# Patient Record
Sex: Female | Born: 1955 | Race: Black or African American | Hispanic: No | Marital: Married | Smoking: Former smoker
Health system: Southern US, Community
[De-identification: ages and names within clinical notes are randomized; demographics above are authoritative.]

## PROBLEM LIST (undated history)

## (undated) DIAGNOSIS — J45909 Unspecified asthma, uncomplicated: Secondary | ICD-10-CM

## (undated) DIAGNOSIS — E119 Type 2 diabetes mellitus without complications: Secondary | ICD-10-CM

## (undated) DIAGNOSIS — E7800 Pure hypercholesterolemia, unspecified: Secondary | ICD-10-CM

## (undated) DIAGNOSIS — G8929 Other chronic pain: Secondary | ICD-10-CM

## (undated) DIAGNOSIS — I10 Essential (primary) hypertension: Secondary | ICD-10-CM

## (undated) DIAGNOSIS — D496 Neoplasm of unspecified behavior of brain: Secondary | ICD-10-CM

## (undated) DIAGNOSIS — K219 Gastro-esophageal reflux disease without esophagitis: Secondary | ICD-10-CM

## (undated) DIAGNOSIS — R519 Headache, unspecified: Secondary | ICD-10-CM

## (undated) DIAGNOSIS — R51 Headache: Secondary | ICD-10-CM

## (undated) HISTORY — PX: KNEE SURGERY: SHX244

## (undated) HISTORY — PX: ABDOMINAL HYSTERECTOMY: SHX81

## (undated) HISTORY — PX: OTHER SURGICAL HISTORY: SHX169

## (undated) HISTORY — PX: SHOULDER SURGERY: SHX246

## (undated) HISTORY — PX: ANTERIOR CRUCIATE LIGAMENT REPAIR: SHX115

---

## 2012-02-16 ENCOUNTER — Emergency Department (HOSPITAL_BASED_OUTPATIENT_CLINIC_OR_DEPARTMENT_OTHER)
Admission: EM | Admit: 2012-02-16 | Discharge: 2012-02-16 | Disposition: A | Discharge status: Skilled Nursing Facility | Payer: Medicare Other | Attending: Emergency Medicine | Admitting: Emergency Medicine

## 2012-02-16 ENCOUNTER — Encounter (HOSPITAL_BASED_OUTPATIENT_CLINIC_OR_DEPARTMENT_OTHER): Payer: Self-pay | Admitting: *Deleted

## 2012-02-16 DIAGNOSIS — E7800 Pure hypercholesterolemia, unspecified: Secondary | ICD-10-CM | POA: Insufficient documentation

## 2012-02-16 DIAGNOSIS — K219 Gastro-esophageal reflux disease without esophagitis: Secondary | ICD-10-CM | POA: Insufficient documentation

## 2012-02-16 DIAGNOSIS — Z7901 Long term (current) use of anticoagulants: Secondary | ICD-10-CM | POA: Insufficient documentation

## 2012-02-16 DIAGNOSIS — R040 Epistaxis: Secondary | ICD-10-CM

## 2012-02-16 DIAGNOSIS — R51 Headache: Secondary | ICD-10-CM | POA: Insufficient documentation

## 2012-02-16 DIAGNOSIS — Z79899 Other long term (current) drug therapy: Secondary | ICD-10-CM | POA: Insufficient documentation

## 2012-02-16 DIAGNOSIS — I10 Essential (primary) hypertension: Secondary | ICD-10-CM | POA: Insufficient documentation

## 2012-02-16 HISTORY — DX: Gastro-esophageal reflux disease without esophagitis: K21.9

## 2012-02-16 HISTORY — DX: Essential (primary) hypertension: I10

## 2012-02-16 HISTORY — DX: Pure hypercholesterolemia, unspecified: E78.00

## 2012-02-16 HISTORY — DX: Neoplasm of unspecified behavior of brain: D49.6

## 2012-02-16 LAB — PROTIME-INR
INR: 1.12 (ref 0.00–1.49)
Prothrombin Time: 14.6 s (ref 11.6–15.2)

## 2012-02-16 MED ORDER — CEPHALEXIN 500 MG PO CAPS: 500.0000 mg | ORAL_CAPSULE | Freq: Two times a day (BID) | ORAL | Status: AC

## 2012-02-16 MED ORDER — OXYCODONE-ACETAMINOPHEN 5-325 MG PO TABS
1.0000 | ORAL_TABLET | Freq: Once | ORAL | Status: AC
  Administered 2012-02-16: 1 via ORAL
  Filled 2012-02-16 (×2): qty 1

## 2012-02-16 NOTE — Discharge Instructions (Signed)
Nosebleed Nosebleeds can be caused by many conditions including trauma, infections, polyps, foreign bodies, dry mucous membranes or climate, medications and air conditioning. Most nosebleeds occur in the front of the nose. It is because of this location that most nosebleeds can be controlled by pinching the nostrils gently and continuously. Do this for at least 10 to 20 minutes. The reason for this long continuous pressure is that you must hold it long enough for the blood to clot. If during that 10 to 20 minute time period, pressure is released, the process may have to be started again. The nosebleed may stop by itself, quit with pressure, need concentrated heating (cautery) or stop with pressure from packing. HOME CARE INSTRUCTIONS   If your nose was packed, try to maintain the pack inside until your caregiver removes it. If a gauze pack was used and it starts to fall out, gently replace or cut the end off. Do not cut if a balloon catheter was used to pack the nose. Otherwise, do not remove unless instructed.   Avoid blowing your nose for 12 hours after treatment. This could dislodge the pack or clot and start bleeding again.   If the bleeding starts again, sit up and bending forward, gently pinch the front half of your nose continuously for 20 minutes.   If bleeding was caused by dry mucous membranes, cover the inside of your nose every morning with a petroleum or antibiotic ointment. Use your little fingertip as an applicator. Do this as needed during dry weather. This will keep the mucous membranes moist and allow them to heal.   Maintain humidity in your home by using less air conditioning or using a humidifier.   Do not use aspirin or medications which make bleeding more likely. Your caregiver can give you recommendations on this.   Resume normal activities as able but try to avoid straining, lifting or bending at the waist for several days.   If the nosebleeds become recurrent and the cause  is unknown, your caregiver may suggest laboratory tests.  SEEK IMMEDIATE MEDICAL CARE IF:   Bleeding recurs and cannot be controlled.   There is unusual bleeding from or bruising on other parts of the body.   You have a fever.   Nosebleeds continue.   There is any worsening of the condition which originally brought you in.   You become lightheaded, feel faint, become sweaty or vomit blood.  MAKE SURE YOU:   Understand these instructions.   Will watch your condition.   Will get help right away if you are not doing well or get worse.  Document Released: 03/20/2005 Document Revised: 05/30/2011 Document Reviewed: 05/12/2009 [employer] Patient Information 2012 [employer]. 

## 2012-02-16 NOTE — ED Notes (Signed)
Pt-int faxed to rehab facility at request of patient and facility RN

## 2012-02-16 NOTE — ED Notes (Signed)
Patient c/o intermittent nosebleed since last night, R knee surgery last week and taking coumadin

## 2012-02-16 NOTE — ED Provider Notes (Signed)
History     CSN: [id number]  Arrival date & time 02/16/12  1028   First MD Initiated Contact with Patient 02/16/12 1058      Chief Complaint  Patient presents with  . Epistaxis    (Consider location/radiation/quality/duration/timing/severity/associated sxs/prior treatment) HPI Comments: Patient is currently residing at a skilled nursing facility for rehabilitation following a right knee surgery a week ago. She is currently on Coumadin to help prevent DVT. She reports that she's had occasional nosebleeds in the past, once requiring packing when she was a child. She developed a nosebleed yesterday only under the left side of her nose, occasionally tasting and swallowing blood down her throat. She reports with holding pressure by herself as well as by staff at her facility, and eventually the bleeding seemed to stop. However during breakfast this morning it started bleeding again and has not been able to be controlled. She reports she has a mild frontal headache which she attributes to the nosebleed and she has not been able to take her pain medication this morning. She denies change in sensorium, numbness or weakness of her extremities. She does take medication for high blood pressure as well. She denies stiff neck, nausea, abdominal pain. She denies any shortness of breath, wheezing.  The history is provided by the patient and a relative.    Past Medical History  Diagnosis Date  . Hypertension   . Acid reflux   . Brain tumor   . High cholesterol     Past Surgical History  Procedure Date  . Knee surgery     right  . Bladder tack   . Shoulder surgery   . Anterior cruciate ligament repair     History reviewed. No pertinent family history.  History  Substance Use Topics  . Smoking status: Former Games developer  . Smokeless tobacco: Not on file  . Alcohol Use: No    OB History    Grav Para Term Preterm Abortions TAB SAB Ect Mult Living                  Review of Systems    Constitutional: Negative for fever and chills.  HENT: Positive for nosebleeds. Negative for sore throat, trouble swallowing and neck pain.   Respiratory: Negative for cough and shortness of breath.   Musculoskeletal: Negative for back pain.  Neurological: Positive for headaches. Negative for dizziness, weakness, light-headedness and numbness.    Allergies  Tramadol  Home Medications   Current Outpatient Rx  Name Route Sig Dispense Refill  . ALBUTEROL SULFATE (2.5 MG/3ML) 0.083% IN NEBU Nebulization Take 2.5 mg by nebulization every 6 (six) hours as needed.    . ATORVASTATIN CALCIUM 80 MG PO TABS Oral Take 40 mg by mouth daily.    Marland Kitchen EZETIMIBE 10 MG PO TABS Oral Take 10 mg by mouth daily.    Marland Kitchen FLUTICASONE-SALMETEROL 250-50 MCG/DOSE IN AEPB Inhalation Inhale 1 puff into the lungs every 12 (twelve) hours.    Marland Kitchen LISINOPRIL 40 MG PO TABS Oral Take 40 mg by mouth daily.    Marland Kitchen OMEPRAZOLE 20 MG PO CPDR Oral Take 20 mg by mouth daily.    . OXYCODONE-ACETAMINOPHEN 5-325 MG PO TABS Oral Take 1 tablet by mouth every 4 (four) hours as needed.    Marland Kitchen POTASSIUM CHLORIDE 20 MEQ PO PACK Oral Take 20 mEq by mouth daily. tablet    . TIOTROPIUM BROMIDE MONOHYDRATE 18 MCG IN CAPS Inhalation Place 18 mcg into inhaler and inhale daily.    Marland Kitchen  TOPIRAMATE 100 MG PO TABS Oral Take 100 mg by mouth at bedtime.    Marland Kitchen VERAPAMIL HCL ER (CO) 180 MG PO TB24 Oral Take 180 mg by mouth 2 (two) times daily.    Marland Kitchen COUMADIN PO Oral Take 2.5 mg by mouth at bedtime.     . CEPHALEXIN 500 MG PO CAPS Oral Take 1 capsule (500 mg total) by mouth 2 (two) times daily. 14 capsule 0    BP 170/94  Pulse 19  Temp 98.2 F (36.8 C) (Oral)  Resp 19  SpO2 95%  Physical Exam  Nursing note and vitals reviewed. Constitutional: She is oriented to person, place, and time. She appears well-developed and well-nourished.  HENT:  Head: Normocephalic and atraumatic.  Nose: Mucosal edema present. No rhinorrhea. Epistaxis is observed.   Mouth/Throat: Uvula is midline, oropharynx is clear and moist and mucous membranes are normal.  Eyes: Pupils are equal, round, and reactive to light. No scleral icterus.  Neck: Normal range of motion. Neck supple.  Cardiovascular: Normal rate.   Pulmonary/Chest: Effort normal. No respiratory distress. She has no wheezes.  Abdominal: Soft. She exhibits no distension. There is no tenderness.  Neurological: She is alert and oriented to person, place, and time. Coordination normal.  Skin: Skin is warm.    ED Course  EPISTAXIS MANAGEMENT Date/Time: 02/16/2012 11:19 AM Performed by: Lear Ng. Authorized by: Lear Ng Consent: Verbal consent obtained. Written consent not obtained. Risks and benefits: risks, benefits and alternatives were discussed Consent given by: patient Patient understanding: patient states understanding of the procedure being performed Patient consent: the patient's understanding of the procedure matches consent given Patient identity confirmed: verbally with patient Time out: Immediately prior to procedure a "time out" was called to verify the correct patient, procedure, equipment, support staff and site/side marked as required. Patient sedated: no Treatment site: left anterior Post-procedure assessment: bleeding decreased Treatment complexity: simple Patient tolerance: Patient tolerated the procedure well with no immediate complications. Comments: 5.5 rapid rhino.  4.5 cc of air initially   (including critical care time)   Labs Reviewed  PROTIME-INR   No results found.   1. Epistaxis    1:25 PM Bleeding improved   MDM  Room air saturation is 95% and I interpret this to be adequate.  Once patient was able to blow out a clot was large from the left side, bleeding continued. Decision made to insert rapid rhino tampon into the left nares, 5.5 cm which has improved bleeding. Given that she is on Coumadin and I think in order to prevent DVT,  continue Coumadin is important, packing was appropriate. Plan is to treat her pain, continue monitoring her, will check a INR here. Otherwise she'll be given a referral to [facility] for followup early this week. We'll also put her on Keflex to help prevent sinusitis.        Gavin Pound. Kamrie Fanton, MD 02/16/12 1325

## 2014-10-05 ENCOUNTER — Encounter (HOSPITAL_BASED_OUTPATIENT_CLINIC_OR_DEPARTMENT_OTHER): Payer: Self-pay | Admitting: *Deleted

## 2014-10-05 ENCOUNTER — Emergency Department (HOSPITAL_BASED_OUTPATIENT_CLINIC_OR_DEPARTMENT_OTHER)
Admission: EM | Admit: 2014-10-05 | Discharge: 2014-10-05 | Disposition: A | Discharge status: Home / Self Care | Payer: Medicare Other | Attending: Emergency Medicine | Admitting: Emergency Medicine

## 2014-10-05 DIAGNOSIS — Z87891 Personal history of nicotine dependence: Secondary | ICD-10-CM | POA: Insufficient documentation

## 2014-10-05 DIAGNOSIS — Z86011 Personal history of benign neoplasm of the brain: Secondary | ICD-10-CM | POA: Insufficient documentation

## 2014-10-05 DIAGNOSIS — K219 Gastro-esophageal reflux disease without esophagitis: Secondary | ICD-10-CM | POA: Diagnosis not present

## 2014-10-05 DIAGNOSIS — J029 Acute pharyngitis, unspecified: Secondary | ICD-10-CM | POA: Diagnosis present

## 2014-10-05 DIAGNOSIS — Z79899 Other long term (current) drug therapy: Secondary | ICD-10-CM | POA: Insufficient documentation

## 2014-10-05 DIAGNOSIS — I10 Essential (primary) hypertension: Secondary | ICD-10-CM | POA: Insufficient documentation

## 2014-10-05 DIAGNOSIS — E780 Pure hypercholesterolemia: Secondary | ICD-10-CM | POA: Diagnosis not present

## 2014-10-05 MED ORDER — KETOROLAC TROMETHAMINE 60 MG/2ML IM SOLN
30.0000 mg | Freq: Once | INTRAMUSCULAR | Status: AC
  Administered 2014-10-05: 30 mg via INTRAMUSCULAR
  Filled 2014-10-05: qty 2

## 2014-10-05 NOTE — ED Notes (Signed)
Patient given water for PO challenge.  

## 2014-10-05 NOTE — ED Notes (Signed)
Pt c/o sore throat x 1 day

## 2014-10-05 NOTE — Discharge Instructions (Signed)
Return to the emergency room with worsening of symptoms, new symptoms or with symptoms that are concerning , especially fevers, stiff neck, worsening headache, nausea/vomiting, visual changes or slurred speech, chest pain, shortness of breath, cough with thick colored mucous or blood Drink plenty of fluids with electrolytes especially Gatorade. OTC cold medications such as mucinex, nyquil, dayquil are recommended. Chloraseptic for sore throat. Ibuprofen 400mg  (2 tablets 200mg ) every 5-6 hours for 3-5 days. Follow up with PCP tomorrow as scheduled. Read below information and follow recommendations. Pharyngitis Pharyngitis is redness, pain, and swelling (inflammation) of your pharynx.  CAUSES  Pharyngitis is usually caused by infection. Most of the time, these infections are from viruses (viral) and are part of a cold. However, sometimes pharyngitis is caused by bacteria (bacterial). Pharyngitis can also be caused by allergies. Viral pharyngitis may be spread from person to person by coughing, sneezing, and personal items or utensils (cups, forks, spoons, toothbrushes). Bacterial pharyngitis may be spread from person to person by more intimate contact, such as kissing.  SIGNS AND SYMPTOMS  Symptoms of pharyngitis include:   Sore throat.   Tiredness (fatigue).   Low-grade fever.   Headache.  Joint pain and muscle aches.  Skin rashes.  Swollen lymph nodes.  Plaque-like film on throat or tonsils (often seen with bacterial pharyngitis). DIAGNOSIS  Your health care provider will ask you questions about your illness and your symptoms. Your medical history, along with a physical exam, is often all that is needed to diagnose pharyngitis. Sometimes, a rapid strep test is done. Other lab tests may also be done, depending on the suspected cause.  TREATMENT  Viral pharyngitis will usually get better in 3-4 days without the use of medicine. Bacterial pharyngitis is treated with medicines that kill  germs (antibiotics).  HOME CARE INSTRUCTIONS   Drink enough water and fluids to keep your urine clear or pale yellow.   Only take over-the-counter or prescription medicines as directed by your health care provider:   If you are prescribed antibiotics, make sure you finish them even if you start to feel better.   Do not take aspirin.   Get lots of rest.   Gargle with 8 oz of salt water ( tsp of salt per 1 qt of water) as often as every 1-2 hours to soothe your throat.   Throat lozenges (if you are not at risk for choking) or sprays may be used to soothe your throat. SEEK MEDICAL CARE IF:   You have large, tender lumps in your neck.  You have a rash.  You cough up green, yellow-brown, or bloody spit. SEEK IMMEDIATE MEDICAL CARE IF:   Your neck becomes stiff.  You drool or are unable to swallow liquids.  You vomit or are unable to keep medicines or liquids down.  You have severe pain that does not go away with the use of recommended medicines.  You have trouble breathing (not caused by a stuffy nose). MAKE SURE YOU:   Understand these instructions.  Will watch your condition.  Will get help right away if you are not doing well or get worse. Document Released: 06/10/2005 Document Revised: 03/31/2013 Document Reviewed: 02/15/2013 [facility] Patient Information 2015 [place], [place]. This information is not intended to replace advice given to you by your health care provider. Make sure you discuss any questions you have with your health care provider.  Salt Water Gargle This solution will help make your mouth and throat feel better. HOME CARE INSTRUCTIONS   Mix 1  teaspoon of salt in 8 ounces of warm water.  Gargle with this solution as much or often as you need or as directed. Swish and gargle gently if you have any sores or wounds in your mouth.  Do not swallow this mixture. Document Released: 03/14/2004 Document Revised: 09/02/2011 Document Reviewed:  08/05/2008 [facility] Patient Information 2015 [place], [place]. This information is not intended to replace advice given to you by your health care provider. Make sure you discuss any questions you have with your health care provider.

## 2014-10-05 NOTE — ED Provider Notes (Signed)
CSN: [id number]     Arrival date & time 10/05/14  1842 History   First MD Initiated Contact with Patient 10/05/14 2022     Chief Complaint  Patient presents with  . Sore Throat     (Consider location/radiation/quality/duration/timing/severity/associated sxs/prior Treatment) HPI  Kimberly Cooley is a 59 y.o. female with PMH of hypertension, acid reflux, elevated cholesterol presenting with sore throat described as scratchy and worse with cough and eating. Patient states Kimberly Cooley has had these symptoms since last night. Cough is nonproductive and Kimberly Cooley denies any fevers, chills. Kimberly Cooley is not taking anything for her symptoms. Kimberly Cooley denies any chest pain, difficulty breathing, shortness of breath. Patient reports primary care appointment tomorrow.   Past Medical History  Diagnosis Date  . Hypertension   . Acid reflux   . Brain tumor   . High cholesterol    Past Surgical History  Procedure Laterality Date  . Knee surgery      right  . Bladder tack    . Shoulder surgery    . Anterior cruciate ligament repair    . Abdominal hysterectomy     History reviewed. No pertinent family history. History  Substance Use Topics  . Smoking status: Former Research scientist (life sciences)  . Smokeless tobacco: Not on file  . Alcohol Use: No   OB History    No data available     Review of Systems  Constitutional: Negative for fever and chills.  HENT: Positive for congestion and sore throat. Negative for rhinorrhea.   Respiratory: Positive for cough. Negative for shortness of breath.   Gastrointestinal: Negative for nausea, vomiting and diarrhea.      Allergies  Tramadol  Home Medications   Prior to Admission medications   Medication Sig Start Date End Date Taking? Authorizing Provider  albuterol (PROVENTIL) (2.5 MG/3ML) 0.083% nebulizer solution Take 2.5 mg by nebulization every 6 (six) hours as needed.    Historical Provider, MD  atorvastatin (LIPITOR) 80 MG tablet Take 40 mg by mouth daily.    Historical Provider,  MD  ezetimibe (ZETIA) 10 MG tablet Take 10 mg by mouth daily.    Historical Provider, MD  Fluticasone-Salmeterol (ADVAIR) 250-50 MCG/DOSE AEPB Inhale 1 puff into the lungs every 12 (twelve) hours.    Historical Provider, MD  lisinopril (PRINIVIL,ZESTRIL) 40 MG tablet Take 40 mg by mouth daily.    Historical Provider, MD  omeprazole (PRILOSEC) 20 MG capsule Take 20 mg by mouth daily.    Historical Provider, MD  oxyCODONE-acetaminophen (PERCOCET/ROXICET) 5-325 MG per tablet Take 1 tablet by mouth every 4 (four) hours as needed.    Historical Provider, MD  potassium chloride (KLOR-CON) 20 MEQ packet Take 20 mEq by mouth daily. tablet    Historical Provider, MD  tiotropium (SPIRIVA) 18 MCG inhalation capsule Place 18 mcg into inhaler and inhale daily.    Historical Provider, MD  topiramate (TOPAMAX) 100 MG tablet Take 100 mg by mouth at bedtime.    Historical Provider, MD  verapamil (COVERA HS) 180 MG (CO) 24 hr tablet Take 180 mg by mouth 2 (two) times daily.    Historical Provider, MD   BP 158/82 mmHg  Pulse 68  Temp(Src) 98.5 F (36.9 C) (Oral)  Resp 20  Ht 5\' 9"  (1.753 m)  Wt 230 lb (104.327 kg)  BMI 33.95 kg/m2  SpO2 98% Physical Exam  Constitutional: Kimberly Cooley appears well-developed and well-nourished. No distress.  HENT:  Head: Normocephalic and atraumatic.  Nose: Right sinus exhibits no maxillary sinus tenderness and no  frontal sinus tenderness. Left sinus exhibits no maxillary sinus tenderness and no frontal sinus tenderness.  Mouth/Throat: Mucous membranes are normal. Posterior oropharyngeal edema and posterior oropharyngeal erythema present. No oropharyngeal exudate.  No trismus or uvula deviation. No facial swelling, tongue swelling.  Eyes: Conjunctivae and EOM are normal. Right eye exhibits no discharge. Left eye exhibits no discharge.  Neck: Normal range of motion. Neck supple.  Cardiovascular: Normal rate, regular rhythm and normal heart sounds.   Pulmonary/Chest: Effort normal and  breath sounds normal. No respiratory distress. Kimberly Cooley has no wheezes. Kimberly Cooley has no rales.  Abdominal: Soft. Bowel sounds are normal. Kimberly Cooley exhibits no distension. There is no tenderness.  Lymphadenopathy:    Kimberly Cooley has cervical adenopathy.  Neurological: Kimberly Cooley is alert.  Skin: Skin is warm and dry. Kimberly Cooley is not diaphoretic.  Nursing note and vitals reviewed.   ED Course  Procedures (including critical care time) Labs Review Labs Reviewed - No data to display  Imaging Review No results found.   EKG Interpretation None      MDM   Final diagnoses:  Viral pharyngitis   Pt afebrile without tonsillar exudate. Patient with central criteria of 1 indicating probably of strep 5-10%. No indication for strep testing or antibiotics. Presents with mild cervical lymphadenopathy, & dysphagia; diagnosis of viral pharyngitis.DC w symptomatic tx for pain  Pt does not appear dehydrated, but did discuss importance of water rehydration. Presentation non concerning for PTA or infxn spread to soft tissue. No trismus or uvula deviation. Specific return precautions discussed. Pt able to drink water in ED without difficulty with intact air way. PCP follow-up in one day as scheduled  Discussed return precautions with patient. Discussed all results and patient verbalizes understanding and agrees with plan.   Al Corpus, PA-C 10/05/14 2144  Ernestina Patches, MD 10/06/14 [phone]

## 2015-03-18 ENCOUNTER — Emergency Department (HOSPITAL_BASED_OUTPATIENT_CLINIC_OR_DEPARTMENT_OTHER): Payer: Medicare Other

## 2015-03-18 ENCOUNTER — Encounter (HOSPITAL_BASED_OUTPATIENT_CLINIC_OR_DEPARTMENT_OTHER): Payer: Self-pay | Admitting: Emergency Medicine

## 2015-03-18 DIAGNOSIS — Y9289 Other specified places as the place of occurrence of the external cause: Secondary | ICD-10-CM | POA: Insufficient documentation

## 2015-03-18 DIAGNOSIS — Z7951 Long term (current) use of inhaled steroids: Secondary | ICD-10-CM | POA: Insufficient documentation

## 2015-03-18 DIAGNOSIS — Y998 Other external cause status: Secondary | ICD-10-CM | POA: Diagnosis not present

## 2015-03-18 DIAGNOSIS — Z79899 Other long term (current) drug therapy: Secondary | ICD-10-CM | POA: Diagnosis not present

## 2015-03-18 DIAGNOSIS — Z87891 Personal history of nicotine dependence: Secondary | ICD-10-CM | POA: Insufficient documentation

## 2015-03-18 DIAGNOSIS — Y9389 Activity, other specified: Secondary | ICD-10-CM | POA: Insufficient documentation

## 2015-03-18 DIAGNOSIS — I10 Essential (primary) hypertension: Secondary | ICD-10-CM | POA: Insufficient documentation

## 2015-03-18 DIAGNOSIS — K219 Gastro-esophageal reflux disease without esophagitis: Secondary | ICD-10-CM | POA: Insufficient documentation

## 2015-03-18 DIAGNOSIS — S93502A Unspecified sprain of left great toe, initial encounter: Secondary | ICD-10-CM | POA: Insufficient documentation

## 2015-03-18 DIAGNOSIS — W010XXA Fall on same level from slipping, tripping and stumbling without subsequent striking against object, initial encounter: Secondary | ICD-10-CM | POA: Insufficient documentation

## 2015-03-18 DIAGNOSIS — Z85841 Personal history of malignant neoplasm of brain: Secondary | ICD-10-CM | POA: Diagnosis not present

## 2015-03-18 DIAGNOSIS — S99922A Unspecified injury of left foot, initial encounter: Secondary | ICD-10-CM | POA: Diagnosis present

## 2015-03-18 DIAGNOSIS — E780 Pure hypercholesterolemia: Secondary | ICD-10-CM | POA: Diagnosis not present

## 2015-03-18 NOTE — ED Notes (Signed)
Patient states that she tripped and fell last night and hurt her left big toe

## 2015-03-19 ENCOUNTER — Emergency Department (HOSPITAL_BASED_OUTPATIENT_CLINIC_OR_DEPARTMENT_OTHER)
Admission: EM | Admit: 2015-03-19 | Discharge: 2015-03-19 | Disposition: A | Discharge status: Home / Self Care | Payer: Medicare Other | Attending: Emergency Medicine | Admitting: Emergency Medicine

## 2015-03-19 DIAGNOSIS — S93502A Unspecified sprain of left great toe, initial encounter: Secondary | ICD-10-CM

## 2015-03-19 HISTORY — DX: Headache, unspecified: R51.9

## 2015-03-19 HISTORY — DX: Headache: R51

## 2015-03-19 MED ORDER — HYDROCODONE-ACETAMINOPHEN 5-325 MG PO TABS: 1.0000 | ORAL_TABLET | Freq: Four times a day (QID) | ORAL | Status: AC | PRN

## 2015-03-19 MED ORDER — HYDROCODONE-ACETAMINOPHEN 5-325 MG PO TABS
1.0000 | ORAL_TABLET | Freq: Once | ORAL | Status: DC
  Filled 2015-03-19: qty 1

## 2015-03-19 NOTE — ED Provider Notes (Signed)
CSN: [id number]     Arrival date & time 03/18/15  2140 History  This chart was scribed for Shanon Rosser, MD by Meriel Pica, ED Scribe. This patient was seen in room MH04/MH04 and the patient's care was started 12:54 AM.  Chief Complaint  Patient presents with  . Toe Injury   The history is provided by the patient. No language interpreter was used.   HPI Comments: Kimberly Cooley is a 59 y.o. female, with a PMhx of HTN and HLD, who presents to the Emergency Department complaining of left great toe pain s/p trip-and-fall injury that occurred last night. She is having "pretty bad" pain, worse with movement or weightbearing. She denies any other injuries attributable to the fall. She has BLE edema at baseline.    Past Medical History  Diagnosis Date  . Hypertension   . Acid reflux   . Brain tumor   . High cholesterol   . Headache    Past Surgical History  Procedure Laterality Date  . Knee surgery      right  . Bladder tack    . Shoulder surgery    . Anterior cruciate ligament repair    . Abdominal hysterectomy     History reviewed. No pertinent family history. Social History  Substance Use Topics  . Smoking status: Former Research scientist (life sciences)  . Smokeless tobacco: None  . Alcohol Use: No   OB History    No data available     Review of Systems 10 Systems reviewed and all are negative for acute change except as noted in the HPI. Allergies  Bactrim and Tramadol  Home Medications   Prior to Admission medications   Medication Sig Start Date End Date Taking? Authorizing Provider  albuterol (PROVENTIL) (2.5 MG/3ML) 0.083% nebulizer solution Take 2.5 mg by nebulization every 6 (six) hours as needed.    Historical Provider, MD  atorvastatin (LIPITOR) 80 MG tablet Take 40 mg by mouth daily.    Historical Provider, MD  ezetimibe (ZETIA) 10 MG tablet Take 10 mg by mouth daily.    Historical Provider, MD  Fluticasone-Salmeterol (ADVAIR) 250-50 MCG/DOSE AEPB Inhale 1 puff into the lungs every 12  (twelve) hours.    Historical Provider, MD  HYDROcodone-acetaminophen (NORCO) 5-325 MG per tablet Take 1-2 tablets by mouth every 6 (six) hours as needed (for pain). 03/19/15   John Molpus, MD  lisinopril (PRINIVIL,ZESTRIL) 40 MG tablet Take 40 mg by mouth daily.    Historical Provider, MD  omeprazole (PRILOSEC) 20 MG capsule Take 20 mg by mouth daily.    Historical Provider, MD  oxyCODONE-acetaminophen (PERCOCET/ROXICET) 5-325 MG per tablet Take 1 tablet by mouth every 4 (four) hours as needed.    Historical Provider, MD  potassium chloride (KLOR-CON) 20 MEQ packet Take 20 mEq by mouth daily. tablet    Historical Provider, MD  tiotropium (SPIRIVA) 18 MCG inhalation capsule Place 18 mcg into inhaler and inhale daily.    Historical Provider, MD  topiramate (TOPAMAX) 100 MG tablet Take 100 mg by mouth at bedtime.    Historical Provider, MD  verapamil (COVERA HS) 180 MG (CO) 24 hr tablet Take 180 mg by mouth 2 (two) times daily.    Historical Provider, MD   BP 196/104 mmHg  Pulse 84  Temp(Src) 98.2 F (36.8 C) (Oral)  Resp 18  Ht 5\' 9"  (1.753 m)  Wt 250 lb (113.399 kg)  BMI 36.90 kg/m2  SpO2 97% Physical Exam General: Well-developed, well-nourished female in no acute distress; appearance consistent  with age of record HENT: normocephalic; atraumatic Eyes: pupils equal, round and reactive to light; extraocular muscles intact Neck: supple Heart: regular rate and rhythm Lungs: clear to auscultation bilaterally Abdomen: soft; nondistended; nontender; bowel sounds present Extremities: Full range of motion except left great toe limited by pain; pulses normal; hallux valgus bilaterally; tenderness of left great toe without significant ecchymosis or swelling; trace edema of lower legs bilaterally  Neurologic: Awake, alert and oriented; motor function intact in all extremities and symmetric; no facial droop Skin: Warm and dry Psychiatric: Normal mood and affect   ED Course  Procedures   DIAGNOSTIC STUDIES: Oxygen Saturation is 97%, RA, normal by my interpretation.    COORDINATION OF CARE: 12:57 AM Discussed treatment plan with pt at bedside and pt agreed to plan.    MDM   Final diagnoses:  Sprain of left great toe, initial encounter   I personally performed the services described in this documentation, which was scribed in my presence. The recorded information has been reviewed and is accurate.    Shanon Rosser, MD 03/19/15 [phone]

## 2015-10-17 ENCOUNTER — Emergency Department (HOSPITAL_BASED_OUTPATIENT_CLINIC_OR_DEPARTMENT_OTHER)
Admission: EM | Admit: 2015-10-17 | Discharge: 2015-10-17 | Disposition: A | Discharge status: Home / Self Care | Payer: Medicare Other | Attending: Emergency Medicine | Admitting: Emergency Medicine

## 2015-10-17 ENCOUNTER — Encounter (HOSPITAL_BASED_OUTPATIENT_CLINIC_OR_DEPARTMENT_OTHER): Payer: Self-pay | Admitting: *Deleted

## 2015-10-17 DIAGNOSIS — R0602 Shortness of breath: Secondary | ICD-10-CM | POA: Diagnosis not present

## 2015-10-17 DIAGNOSIS — D496 Neoplasm of unspecified behavior of brain: Secondary | ICD-10-CM | POA: Diagnosis not present

## 2015-10-17 DIAGNOSIS — Z7722 Contact with and (suspected) exposure to environmental tobacco smoke (acute) (chronic): Secondary | ICD-10-CM | POA: Insufficient documentation

## 2015-10-17 DIAGNOSIS — I10 Essential (primary) hypertension: Secondary | ICD-10-CM | POA: Diagnosis not present

## 2015-10-17 DIAGNOSIS — M109 Gout, unspecified: Secondary | ICD-10-CM | POA: Diagnosis not present

## 2015-10-17 DIAGNOSIS — M79643 Pain in unspecified hand: Secondary | ICD-10-CM | POA: Diagnosis present

## 2015-10-17 MED ORDER — HYDROCODONE-ACETAMINOPHEN 5-325 MG PO TABS
2.0000 | ORAL_TABLET | Freq: Once | ORAL | Status: DC
  Filled 2015-10-17: qty 2

## 2015-10-17 MED ORDER — COLCHICINE 0.6 MG PO TABS
0.6000 mg | ORAL_TABLET | Freq: Once | ORAL | Status: AC
  Administered 2015-10-17: 0.6 mg via ORAL
  Filled 2015-10-17: qty 1

## 2015-10-17 MED ORDER — LOSARTAN POTASSIUM 25 MG PO TABS: 25.0000 mg | ORAL_TABLET | Freq: Every day | ORAL | Status: AC

## 2015-10-17 MED ORDER — COLCHICINE 0.6 MG PO TABS: 0.6000 mg | ORAL_TABLET | Freq: Two times a day (BID) | ORAL | Status: AC

## 2015-10-17 MED ORDER — VERAPAMIL HCL ER 180 MG PO TBCR: 180.0000 mg | EXTENDED_RELEASE_TABLET | Freq: Every day | ORAL | Status: AC

## 2015-10-17 MED ORDER — PREDNISONE 50 MG PO TABS
60.0000 mg | ORAL_TABLET | Freq: Once | ORAL | Status: AC
  Administered 2015-10-17: 60 mg via ORAL
  Filled 2015-10-17: qty 1

## 2015-10-17 MED ORDER — INDOMETHACIN 25 MG PO CAPS: 25.0000 mg | ORAL_CAPSULE | Freq: Three times a day (TID) | ORAL | Status: AC | PRN

## 2015-10-17 NOTE — ED Notes (Signed)
She ran out of BP medication 5 days ago. Pain in her right hand x 6 days and swelling in her right ankle x 2 days.

## 2015-10-17 NOTE — ED Provider Notes (Signed)
CSN: [id number]     Arrival date & time 10/17/15  1910 History   First MD Initiated Contact with Patient 10/17/15 1953     Chief Complaint  Patient presents with  . Ankle Pain  . Hand Pain     (Consider location/radiation/quality/duration/timing/severity/associated sxs/prior Treatment) HPI Comments: Patient reports to ED with complaint of right ankle pain and right wrist pain. Ankle pain started approximately 24 hours ago. Was acute in onset, severe 10/10, characterized as "on fire." Associated swelling, warmth, and discoloration endorsed. No history of trauma to the ankle. History of hypertension; however, she has not been on her medications for about a week. No history of gout.    Right wrist pain started approximately 4.5 weeks ago. Pain is intermittent in nature. When initially experienced discomfort, patient reported not being able to move her wrist. Pain is worse with movement. Has tried an ace bandage with minimal relief of pain. History of tendonitis.    Patient is a 60 y.o. female presenting with ankle pain and hand pain. The history is provided by the patient and the spouse.  Ankle Pain Location:  Ankle Time since incident:  1 day Injury: no   Ankle location:  R ankle Associated symptoms: no fatigue and no fever   Hand Pain This is a new problem. The current episode started more than 1 month ago. The problem has been waxing and waning. Associated symptoms include arthralgias and joint swelling. Pertinent negatives include no chest pain, chills, coughing, diaphoresis, fatigue or fever.    Past Medical History  Diagnosis Date  . Hypertension   . Acid reflux   . Brain tumor ([place])   . High cholesterol   . Headache    Past Surgical History  Procedure Laterality Date  . Knee surgery      right  . Bladder tack    . Shoulder surgery    . Anterior cruciate ligament repair    . Abdominal hysterectomy     History reviewed. No pertinent family history. Social History    Substance Use Topics  . Smoking status: Former Research scientist (life sciences)  . Smokeless tobacco: None  . Alcohol Use: No   OB History    No data available     Review of Systems  Constitutional: Negative for fever, chills, diaphoresis and fatigue.  Respiratory: Positive for shortness of breath. Negative for cough.   Cardiovascular: Negative for chest pain.  Musculoskeletal: Positive for joint swelling and arthralgias.  Skin: Positive for color change.      Allergies  Bactrim and Tramadol  Home Medications   Prior to Admission medications   Medication Sig Start Date End Date Taking? Authorizing Provider  albuterol (PROVENTIL) (2.5 MG/3ML) 0.083% nebulizer solution Take 2.5 mg by nebulization every 6 (six) hours as needed.    Historical Provider, MD  atorvastatin (LIPITOR) 80 MG tablet Take 40 mg by mouth daily.    Historical Provider, MD  colchicine 0.6 MG tablet Take 1 tablet (0.6 mg total) by mouth 2 (two) times daily. 10/17/15   Roxanna Mew, PA-C  ezetimibe (ZETIA) 10 MG tablet Take 10 mg by mouth daily.    Historical Provider, MD  Fluticasone-Salmeterol (ADVAIR) 250-50 MCG/DOSE AEPB Inhale 1 puff into the lungs every 12 (twelve) hours.    Historical Provider, MD  HYDROcodone-acetaminophen (NORCO) 5-325 MG per tablet Take 1-2 tablets by mouth every 6 (six) hours as needed (for pain). 03/19/15   John Molpus, MD  indomethacin (INDOCIN) 25 MG capsule Take 1 capsule (25  mg total) by mouth 3 (three) times daily as needed. 10/17/15   Roxanna Mew, PA-C  lisinopril (PRINIVIL,ZESTRIL) 40 MG tablet Take 40 mg by mouth daily.    Historical Provider, MD  losartan (COZAAR) 25 MG tablet Take 1 tablet (25 mg total) by mouth daily. 10/17/15   Roxanna Mew, PA-C  omeprazole (PRILOSEC) 20 MG capsule Take 20 mg by mouth daily.    Historical Provider, MD  oxyCODONE-acetaminophen (PERCOCET/ROXICET) 5-325 MG per tablet Take 1 tablet by mouth every 4 (four) hours as needed.    Historical Provider, MD   potassium chloride (KLOR-CON) 20 MEQ packet Take 20 mEq by mouth daily. tablet    Historical Provider, MD  tiotropium (SPIRIVA) 18 MCG inhalation capsule Place 18 mcg into inhaler and inhale daily.    Historical Provider, MD  topiramate (TOPAMAX) 100 MG tablet Take 100 mg by mouth at bedtime.    Historical Provider, MD  verapamil (CALAN-SR) 180 MG CR tablet Take 1 tablet (180 mg total) by mouth at bedtime. 10/17/15   Roxanna Mew, PA-C   BP 181/92 mmHg  Pulse 88  Temp(Src) 98.2 F (36.8 C) (Oral)  Resp 18  Ht 5\' 7"  (1.702 m)  Wt 102.059 kg  BMI 35.23 kg/m2  SpO2 96% Physical Exam  Constitutional: She appears well-developed and well-nourished. She appears distressed.  HENT:  Head: Normocephalic and atraumatic.  Eyes: Conjunctivae are normal. No scleral icterus.  Neck: Normal range of motion.  Cardiovascular: Normal rate, regular rhythm, normal heart sounds and intact distal pulses.   No murmur heard. Pulmonary/Chest: Effort normal and breath sounds normal. No respiratory distress.  Abdominal: Soft. Bowel sounds are normal. She exhibits no distension. There is no tenderness.  Musculoskeletal:       Arms:      Feet:     Neurological: She is alert.  Skin: Skin is warm, dry and intact. She is not diaphoretic.  Psychiatric: She has a normal mood and affect.    ED Course  Procedures (including critical care time) Labs Review Labs Reviewed - No data to display  Imaging Review No results found.    EKG Interpretation None      MDM   Final diagnoses:  Gout of multiple sites, unspecified cause, unspecified chronicity  Essential hypertension    Patient reports to ED with complaint of right wrist x 4.5 weeks and right ankle pain x 1 day. With no history of no recent trauma to the foot/ankle and patient is afebrile with no open wounds or streaking noted at right foot/ankle - less concern for septic joint. With the sudden onset, localization to the joint, and  significant pain most likely acute gouty attack. Wrist may also be a benign gouty attack that has not resolved given the severity of the initial attack with which she was unable to bend/move her right wrist. ?Possible tendonitis with positive Finkelstein's test per Dr. Drue Stager evaluation; however, no history of repetitive movement activities. Will treat now for gout with colchicine, indomethacin for inflammation and pain, and pain medication. Encourage follow up with primary care. If symptoms worsen, return to ED.   Of note, patient's blood pressure is markedly elevated. Patient reports she has not have her blood pressure medications for approximately 1 week due to changes with insurance. Will refill her home medications of losartan and verapamil until she is able to see her PCP. Educated patient to not take the verapamil while she is taking the colchicine.   Patient voiced understanding and  is agreeable to treatment plan.     Roxanna Mew, PA-C 10/18/15 0401  Tanna Furry, MD 10/28/15 2105

## 2015-10-17 NOTE — Discharge Instructions (Signed)
Do not take your verapamil while you are taking the colchicine. Follow up with your PCP in the next three days for blood pressure control and ED visit. If your symptoms worsen please return to the ED.    Gout Gout is an inflammatory arthritis caused by a buildup of uric acid crystals in the joints. Uric acid is a chemical that is normally present in the blood. When the level of uric acid in the blood is too high it can form crystals that deposit in your joints and tissues. This causes joint redness, soreness, and swelling (inflammation). Repeat attacks are common. Over time, uric acid crystals can form into masses (tophi) near a joint, destroying bone and causing disfigurement. Gout is treatable and often preventable. CAUSES  The disease begins with elevated levels of uric acid in the blood. Uric acid is produced by your body when it breaks down a naturally found substance called purines. Certain foods you eat, such as meats and fish, contain high amounts of purines. Causes of an elevated uric acid level include:  Being passed down from parent to child (heredity).  Diseases that cause increased uric acid production (such as obesity, psoriasis, and certain cancers).  Excessive alcohol use.  Diet, especially diets rich in meat and seafood.  Medicines, including certain cancer-fighting medicines (chemotherapy), water pills (diuretics), and aspirin.  Chronic kidney disease. The kidneys are no longer able to remove uric acid well.  Problems with metabolism. Conditions strongly associated with gout include:  Obesity.  High blood pressure.  High cholesterol.  Diabetes. Not everyone with elevated uric acid levels gets gout. It is not understood why some people get gout and others do not. Surgery, joint injury, and eating too much of certain foods are some of the factors that can lead to gout attacks. SYMPTOMS   An attack of gout comes on quickly. It causes intense pain with redness,  swelling, and warmth in a joint.  Fever can occur.  Often, only one joint is involved. Certain joints are more commonly involved:  Base of the big toe.  Knee.  Ankle.  Wrist.  Finger. Without treatment, an attack usually goes away in a few days to weeks. Between attacks, you usually will not have symptoms, which is different from many other forms of arthritis. DIAGNOSIS  Your caregiver will suspect gout based on your symptoms and exam. In some cases, tests may be recommended. The tests may include:  Blood tests.  Urine tests.  X-rays.  Joint fluid exam. This exam requires a needle to remove fluid from the joint (arthrocentesis). Using a microscope, gout is confirmed when uric acid crystals are seen in the joint fluid. TREATMENT  There are two phases to gout treatment: treating the sudden onset (acute) attack and preventing attacks (prophylaxis).  Treatment of an Acute Attack.  Medicines are used. These include anti-inflammatory medicines or steroid medicines.  An injection of steroid medicine into the affected joint is sometimes necessary.  The painful joint is rested. Movement can worsen the arthritis.  You may use warm or cold treatments on painful joints, depending which works best for you.  Treatment to Prevent Attacks.  If you suffer from frequent gout attacks, your caregiver may advise preventive medicine. These medicines are started after the acute attack subsides. These medicines either help your kidneys eliminate uric acid from your body or decrease your uric acid production. You may need to stay on these medicines for a very long time.  The early phase of treatment with  preventive medicine can be associated with an increase in acute gout attacks. For this reason, during the first few months of treatment, your caregiver may also advise you to take medicines usually used for acute gout treatment. Be sure you understand your caregiver's directions. Your caregiver may  make several adjustments to your medicine dose before these medicines are effective.  Discuss dietary treatment with your caregiver or dietitian. Alcohol and drinks high in sugar and fructose and foods such as meat, poultry, and seafood can increase uric acid levels. Your caregiver or dietitian can advise you on drinks and foods that should be limited. HOME CARE INSTRUCTIONS   Do not take aspirin to relieve pain. This raises uric acid levels.  Only take over-the-counter or prescription medicines for pain, discomfort, or fever as directed by your caregiver.  Rest the joint as much as possible. When in bed, keep sheets and blankets off painful areas.  Keep the affected joint raised (elevated).  Apply warm or cold treatments to painful joints. Use of warm or cold treatments depends on which works best for you.  Use crutches if the painful joint is in your leg.  Drink enough fluids to keep your urine clear or pale yellow. This helps your body get rid of uric acid. Limit alcohol, sugary drinks, and fructose drinks.  Follow your dietary instructions. Pay careful attention to the amount of protein you eat. Your daily diet should emphasize fruits, vegetables, whole grains, and fat-free or low-fat milk products. Discuss the use of coffee, vitamin C, and cherries with your caregiver or dietitian. These may be helpful in lowering uric acid levels.  Maintain a healthy body weight. SEEK MEDICAL CARE IF:   You develop diarrhea, vomiting, or any side effects from medicines.  You do not feel better in 24 hours, or you are getting worse. SEEK IMMEDIATE MEDICAL CARE IF:   Your joint becomes suddenly more tender, and you have chills or a fever. MAKE SURE YOU:   Understand these instructions.  Will watch your condition.  Will get help right away if you are not doing well or get worse.   This information is not intended to replace advice given to you by your health care provider. Make sure you  discuss any questions you have with your health care provider.   Document Released: 06/07/2000 Document Revised: 07/01/2014 Document Reviewed: 01/22/2012 Elsevier Interactive Patient Education [employer].

## 2015-10-17 NOTE — ED Provider Notes (Signed)
Pt with right wrist pain, and right ankle pain. Atraumatic to both. No repetitive motion. Exam the right hand shows tenderness along the APL and EPB tendons and a positive Finkelstein's test. No repetitive motion to suggest an etiology for her declare veins tendinitis. Joint effusion and some tenderness without redness warmth about the ankle joint. Well localized to the ankle joint itself. No history of gout. Treatment with colchicine, indomethacin, pain medication, primary care follow-up.  Tanna Furry, MD 10/17/15 [phone]

## 2015-10-17 NOTE — ED Notes (Signed)
CMS intact before and after. Pt tolerated well. Pt had no questions.  

## 2015-10-18 ENCOUNTER — Encounter (HOSPITAL_BASED_OUTPATIENT_CLINIC_OR_DEPARTMENT_OTHER): Payer: Self-pay | Admitting: Student

## 2016-09-13 ENCOUNTER — Encounter (HOSPITAL_BASED_OUTPATIENT_CLINIC_OR_DEPARTMENT_OTHER): Payer: Self-pay | Admitting: *Deleted

## 2016-09-13 ENCOUNTER — Emergency Department (HOSPITAL_BASED_OUTPATIENT_CLINIC_OR_DEPARTMENT_OTHER)
Admission: EM | Admit: 2016-09-13 | Discharge: 2016-09-13 | Disposition: A | Discharge status: Home / Self Care | Payer: Medicare Other | Attending: Emergency Medicine | Admitting: Emergency Medicine

## 2016-09-13 ENCOUNTER — Emergency Department (HOSPITAL_BASED_OUTPATIENT_CLINIC_OR_DEPARTMENT_OTHER): Payer: Medicare Other

## 2016-09-13 DIAGNOSIS — E119 Type 2 diabetes mellitus without complications: Secondary | ICD-10-CM | POA: Diagnosis not present

## 2016-09-13 DIAGNOSIS — J111 Influenza due to unidentified influenza virus with other respiratory manifestations: Secondary | ICD-10-CM

## 2016-09-13 DIAGNOSIS — R05 Cough: Secondary | ICD-10-CM | POA: Insufficient documentation

## 2016-09-13 DIAGNOSIS — R109 Unspecified abdominal pain: Secondary | ICD-10-CM | POA: Diagnosis not present

## 2016-09-13 DIAGNOSIS — J029 Acute pharyngitis, unspecified: Secondary | ICD-10-CM | POA: Insufficient documentation

## 2016-09-13 DIAGNOSIS — R197 Diarrhea, unspecified: Secondary | ICD-10-CM | POA: Diagnosis not present

## 2016-09-13 DIAGNOSIS — Z87891 Personal history of nicotine dependence: Secondary | ICD-10-CM | POA: Diagnosis not present

## 2016-09-13 DIAGNOSIS — I10 Essential (primary) hypertension: Secondary | ICD-10-CM | POA: Diagnosis not present

## 2016-09-13 DIAGNOSIS — Z79899 Other long term (current) drug therapy: Secondary | ICD-10-CM | POA: Diagnosis not present

## 2016-09-13 DIAGNOSIS — R062 Wheezing: Secondary | ICD-10-CM | POA: Insufficient documentation

## 2016-09-13 DIAGNOSIS — R69 Illness, unspecified: Secondary | ICD-10-CM

## 2016-09-13 LAB — RAPID STREP SCREEN (MED CTR MEBANE ONLY): Streptococcus, Group A Screen (Direct): NEGATIVE

## 2016-09-13 MED ORDER — DEXAMETHASONE SODIUM PHOSPHATE 10 MG/ML IJ SOLN
10.0000 mg | Freq: Once | INTRAMUSCULAR | Status: AC
  Administered 2016-09-13: 10 mg via INTRAMUSCULAR
  Filled 2016-09-13: qty 1

## 2016-09-13 MED ORDER — PREDNISONE 10 MG PO TABS: 20.0000 mg | ORAL_TABLET | Freq: Two times a day (BID) | ORAL | 0 refills | Status: AC

## 2016-09-13 MED ORDER — ALBUTEROL SULFATE HFA 108 (90 BASE) MCG/ACT IN AERS: 2.0000 | INHALATION_SPRAY | RESPIRATORY_TRACT | 1 refills | Status: DC | PRN

## 2016-09-13 MED ORDER — BENZONATATE 100 MG PO CAPS: 100.0000 mg | ORAL_CAPSULE | Freq: Three times a day (TID) | ORAL | 0 refills | Status: DC

## 2016-09-13 MED ORDER — ALBUTEROL SULFATE (2.5 MG/3ML) 0.083% IN NEBU: 2.5000 mg | INHALATION_SOLUTION | Freq: Four times a day (QID) | RESPIRATORY_TRACT | 0 refills | Status: DC | PRN

## 2016-09-13 MED FILL — BENZONATATE 100 MG CAP: 100 | 7 days supply | Qty: 21 | Fill #0

## 2016-09-13 MED FILL — ALBUTEROL 0.083% INHAL SOLN: (2.5 MG/3ML | 6 days supply | Qty: 75 | Fill #0

## 2016-09-13 MED FILL — predniSONE 10 MG TABS: 10 | 5 days supply | Qty: 20 | Fill #0

## 2016-09-13 NOTE — Discharge Instructions (Signed)
There were no acute abnormalities noted on the chest x-ray. The strep test was negative. Your symptoms are consistent with a viral illness. Viruses do not require antibiotics. Treatment is symptomatic care and it is important to note that these symptoms may last for 7-14 days.   Hand washing: Wash your hands throughout the day, but especially before and after touching the face, using the restroom, sneezing, coughing, or touching surfaces that have been coughed or sneezed upon. Hydration: Symptoms will be intensified and complicated by dehydration. Dehydration can also extend the duration of symptoms. Drink plenty of fluids and get plenty of rest. You should be drinking at least half a liter of water an hour to stay hydrated. Electrolyte drinks are also encouraged. You should be drinking enough fluids to make your urine light yellow, almost clear. If this is not the case, you are not drinking enough water. Please note that some of the treatments indicated below will not be effective if you are not adequately hydrated. Pain or fever: Tylenol for pain or fever.  Cough: Use the Tessalon for cough.  Congestion: Plain Mucinex may help relieve congestion. Saline sinus rinses and saline nasal sprays may also help relieve congestion.  Sore throat: Warm liquids or Chloraseptic spray may help soothe a sore throat. Gargle twice a day with a salt water solution made from a half teaspoon of salt in a cup of warm water.  Prednisone: Take the prednisone, as prescribed, for the next 5 days. Note that the prednisone may temporarily raise your blood sugar. Follow up: Follow up with a primary care provider as soon as possible for further management. Should symptoms worsen and you have to return to the ED, please proceed to either [facility] or [facility].

## 2016-09-13 NOTE — ED Provider Notes (Signed)
[name] Provider Note   CSN: [id number] Arrival date & time: 09/13/16  1400  By signing my name below, I, Dora Sims, attest that this documentation has been prepared under the direction and in the presence of Derin Matthes, PA-C. Electronically Signed: Dora Sims, Scribe. 09/13/2016. 4:02 PM.  History   Chief Complaint Chief Complaint  Patient presents with  . Cough  . Sore Throat    The history is provided by the patient and the spouse. No language interpreter was used.     HPI Comments: Kimberly Cooley is a 61 y.o. female with PMHx including HTN, DM, asthma, and high cholesterol who presents to the Emergency Department complaining of a constant, moderate to severe, sharp, sore throat beginning 3 days ago. She reports associated non-productive cough, occasional non-bloody diarrhea, and abdominal soreness only present when coughing. Pt has been drinking hot tea with lemon and honey with mild improvement of her symptoms. She has an albuterol inhaler and nebulizer for asthma and has used them for her current cough with no significant improvement; she notes she has run out of her inhaler. Pt's husband was diagnosed with influenza this season but he notes his symptoms have resolved; pt denies other known sick contacts. She received a seasonal influenza vaccination. She states her diabetes is well controlled and her blood sugar usually runs around 115. She denies fever, N/V, SOB, CP, or any other associated symptoms.     Past Medical History:  Diagnosis Date  . Acid reflux   . Brain tumor ([place])   . Headache   . High cholesterol   . Hypertension     There are no active problems to display for this patient.   Past Surgical History:  Procedure Laterality Date  . ABDOMINAL HYSTERECTOMY    . ANTERIOR CRUCIATE LIGAMENT REPAIR    . bladder tack    . KNEE SURGERY     right  . SHOULDER SURGERY      OB History    No data available       Home Medications     Prior to Admission medications   Medication Sig Start Date End Date Taking? Authorizing Provider  albuterol (PROVENTIL) (2.5 MG/3ML) 0.083% nebulizer solution Take 2.5 mg by nebulization every 6 (six) hours as needed.   Yes Historical Provider, MD  atorvastatin (LIPITOR) 80 MG tablet Take 40 mg by mouth daily.   Yes Historical Provider, MD  ezetimibe (ZETIA) 10 MG tablet Take 10 mg by mouth daily.   Yes Historical Provider, MD  Fluticasone-Salmeterol (ADVAIR) 250-50 MCG/DOSE AEPB Inhale 1 puff into the lungs every 12 (twelve) hours.   Yes Historical Provider, MD  LABETALOL HCL PO Take by mouth.   Yes Historical Provider, MD  losartan (COZAAR) 25 MG tablet Take 1 tablet (25 mg total) by mouth daily. 10/17/15  Yes Frederica Kuster, PA-C  omeprazole (PRILOSEC) 20 MG capsule Take 20 mg by mouth daily.   Yes Historical Provider, MD  oxyCODONE-acetaminophen (PERCOCET/ROXICET) 5-325 MG per tablet Take 1 tablet by mouth every 4 (four) hours as needed.   Yes Historical Provider, MD  potassium chloride (KLOR-CON) 20 MEQ packet Take 20 mEq by mouth daily. tablet   Yes Historical Provider, MD  tiotropium (SPIRIVA) 18 MCG inhalation capsule Place 18 mcg into inhaler and inhale daily.   Yes Historical Provider, MD  topiramate (TOPAMAX) 100 MG tablet Take 100 mg by mouth at bedtime.   Yes Historical Provider, MD  verapamil (CALAN-SR) 180 MG CR tablet  Take 1 tablet (180 mg total) by mouth at bedtime. 10/17/15  Yes Frederica Kuster, PA-C  albuterol (PROVENTIL HFA;VENTOLIN HFA) 108 (90 Base) MCG/ACT inhaler Inhale 2 puffs into the lungs every 4 (four) hours as needed for wheezing or shortness of breath. 09/13/16   Nyia Tsao C Keylin Ferryman, PA-C  albuterol (PROVENTIL) (2.5 MG/3ML) 0.083% nebulizer solution Take 3 mLs (2.5 mg total) by nebulization every 6 (six) hours as needed for wheezing or shortness of breath. 09/13/16   Makena Murdock C Kennedy Brines, PA-C  benzonatate (TESSALON) 100 MG capsule Take 1 capsule (100 mg total) by mouth every 8 (eight)  hours. 09/13/16   Zuriel Yeaman C Malayja Freund, PA-C  colchicine 0.6 MG tablet Take 1 tablet (0.6 mg total) by mouth 2 (two) times daily. 10/17/15   Frederica Kuster, PA-C  HYDROcodone-acetaminophen (NORCO) 5-325 MG per tablet Take 1-2 tablets by mouth every 6 (six) hours as needed (for pain). 03/19/15   John Molpus, MD  indomethacin (INDOCIN) 25 MG capsule Take 1 capsule (25 mg total) by mouth 3 (three) times daily as needed. 10/17/15   Frederica Kuster, PA-C  lisinopril (PRINIVIL,ZESTRIL) 40 MG tablet Take 40 mg by mouth daily.    Historical Provider, MD    Family History No family history on file.  Social History Social History  Substance Use Topics  . Smoking status: Former Research scientist (life sciences)  . Smokeless tobacco: Never Used  . Alcohol use No     Allergies   Bactrim [sulfamethoxazole-trimethoprim]; Lisinopril; Sulfa antibiotics; and Tramadol   Review of Systems Review of Systems  Constitutional: Negative for fever.  HENT: Positive for sore throat. Negative for trouble swallowing.   Respiratory: Positive for cough. Negative for shortness of breath.   Cardiovascular: Negative for chest pain.  Gastrointestinal: Positive for abdominal pain (secondary to cough) and diarrhea. Negative for nausea and vomiting.  All other systems reviewed and are negative.  Physical Exam Updated Vital Signs BP (!) 160/97 (BP Location: Left Arm)   Pulse 80   Temp 98.7 F (37.1 C) (Oral)   Resp 20   Ht 5\' 7"  (1.702 m)   Wt 265 lb (120.2 kg)   SpO2 100%   BMI 41.50 kg/m   Physical Exam  Constitutional: She appears well-developed and well-nourished. No distress.  HENT:  Head: Normocephalic and atraumatic.  Mouth/Throat: No trismus in the jaw. Posterior oropharyngeal edema and posterior oropharyngeal erythema present. No tonsillar exudate.  No trismus. Handles oral secretions without difficulty.  Eyes: Conjunctivae are normal.  Neck: Normal range of motion. Neck supple.  Cardiovascular: Normal rate, regular rhythm, normal  heart sounds and intact distal pulses.   Pulmonary/Chest: Effort normal and breath sounds normal. No respiratory distress.  No increased work of breathing.   Abdominal: Soft. There is no tenderness. There is no guarding.  Musculoskeletal: She exhibits no edema.  Lymphadenopathy:    She has no cervical adenopathy.  Neurological: She is alert.  Skin: Skin is warm and dry. She is not diaphoretic.  Psychiatric: She has a normal mood and affect. Her behavior is normal.  Nursing note and vitals reviewed.   ED Treatments / Results  Labs (all labs ordered are listed, but only abnormal results are displayed) Labs Reviewed  RAPID STREP SCREEN (NOT AT [facility])  CULTURE, GROUP A STREP [facility])    EKG  EKG Interpretation None       Radiology No results found.  Procedures Procedures (including critical care time)  DIAGNOSTIC STUDIES: Oxygen Saturation is 100% on RA, normal by my  interpretation.    COORDINATION OF CARE: 4:12 PM Discussed treatment plan with pt at bedside and pt agreed to plan.  Medications Ordered in ED Medications  dexamethasone (DECADRON) injection 10 mg (10 mg Intramuscular Given 09/13/16 1648)     Initial Impression / Assessment and Plan / ED Course  I have reviewed the triage vital signs and the nursing notes.  Pertinent labs & imaging results that were available during my care of the patient were reviewed by me and considered in my medical decision making (see chart for details).       Final Clinical Impressions(s) / ED Diagnoses   Patient presents with symptoms consistent with a viral illness. She is nontoxic appearing and has no signs of sepsis. PCP follow up as needed. The patient was given instructions for home care as well as return precautions. Patient voices understanding of these instructions, accepts the plan, and is comfortable with discharge.  5:23 PM: Shared decision making was had regarding prednisone administration. Pros and cons discussed.  Patient acknowledged this information and opted for prednisone therapy.     Final diagnoses:  Influenza-like illness    New Prescriptions Discharge Medication List as of 09/13/2016  5:23 PM    START taking these medications   Details  albuterol (PROVENTIL HFA;VENTOLIN HFA) 108 (90 Base) MCG/ACT inhaler Inhale 2 puffs into the lungs every 4 (four) hours as needed for wheezing or shortness of breath., Starting Fri 09/13/2016, Print    !! albuterol (PROVENTIL) (2.5 MG/3ML) 0.083% nebulizer solution Take 3 mLs (2.5 mg total) by nebulization every 6 (six) hours as needed for wheezing or shortness of breath., Starting Fri 09/13/2016, Print    benzonatate (TESSALON) 100 MG capsule Take 1 capsule (100 mg total) by mouth every 8 (eight) hours., Starting Fri 09/13/2016, Print    predniSONE (DELTASONE) 10 MG tablet Take 2 tablets (20 mg total) by mouth 2 (two) times daily with a meal., Starting Fri 09/13/2016, Until Wed 09/18/2016, Print     !! - Potential duplicate medications found. Please discuss with provider.     I personally performed the services described in this documentation, which was scribed in my presence. The recorded information has been reviewed and is accurate.    Lorayne Bender, PA-C 09/19/16 2101    Veryl Speak, MD 09/19/16 2119

## 2016-09-13 NOTE — ED Triage Notes (Signed)
Cough and sore throat x 2 days.

## 2016-09-13 NOTE — ED Notes (Signed)
PA at bedside.

## 2016-09-16 LAB — CULTURE, GROUP A STREP (THRC)

## 2016-11-10 IMAGING — DX DG FOOT COMPLETE 3+V*L*
3 series · 3 of 3 positions shown · non-contrast
Comparison: None.

CLINICAL DATA: Left foot and great toe pain after tripping and
falling last night.

EXAM:
LEFT FOOT - COMPLETE 3+ VIEW

[foot ap]
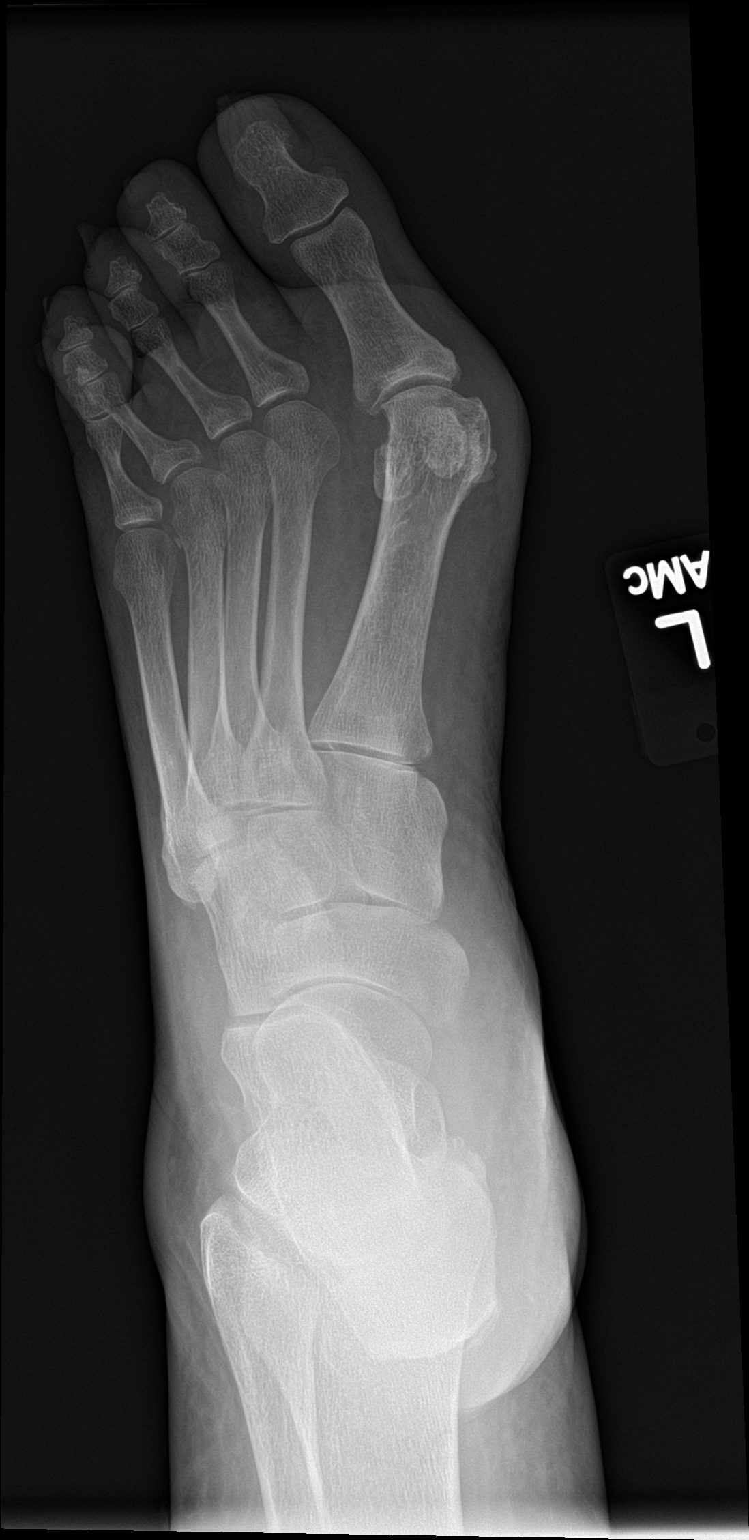

[foot obl]
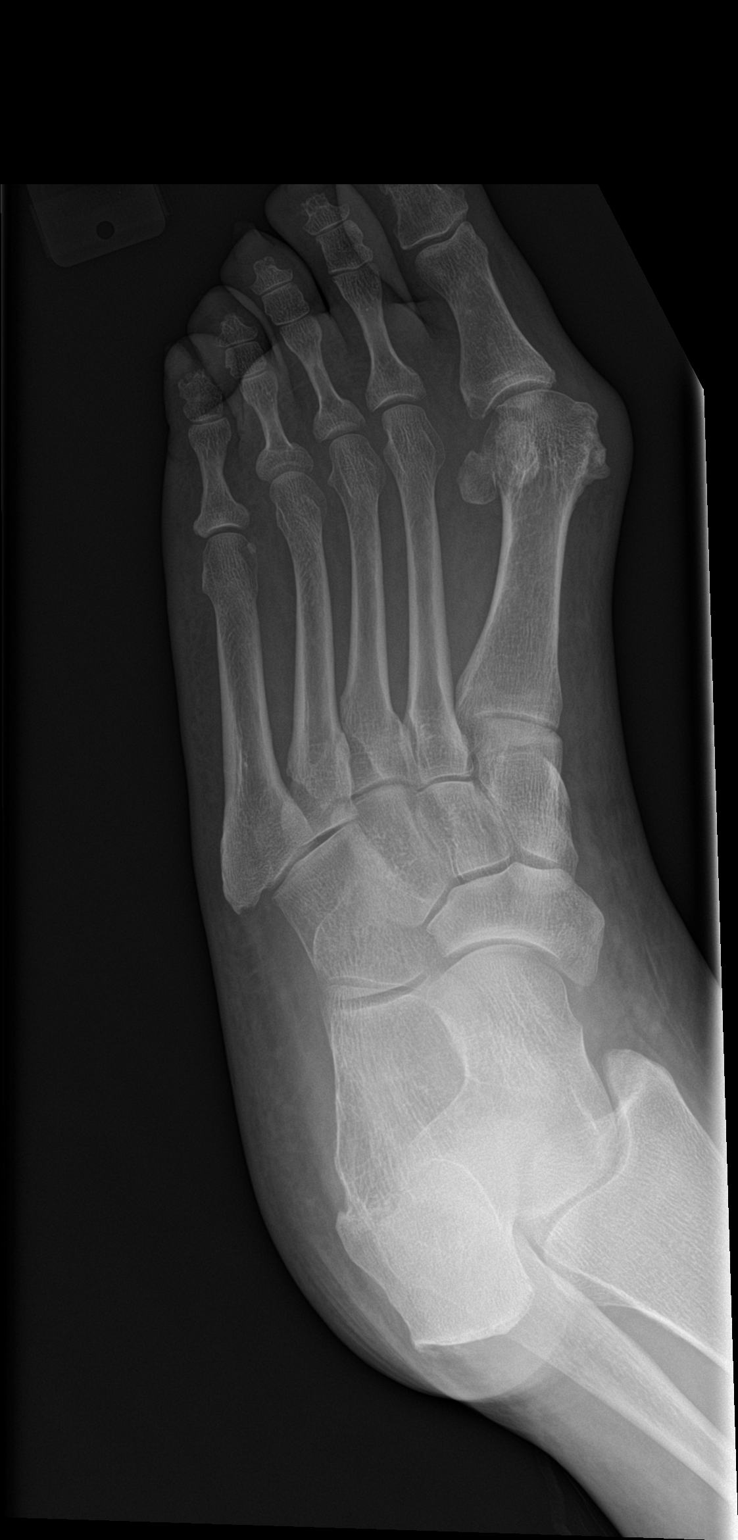

[foot lat]
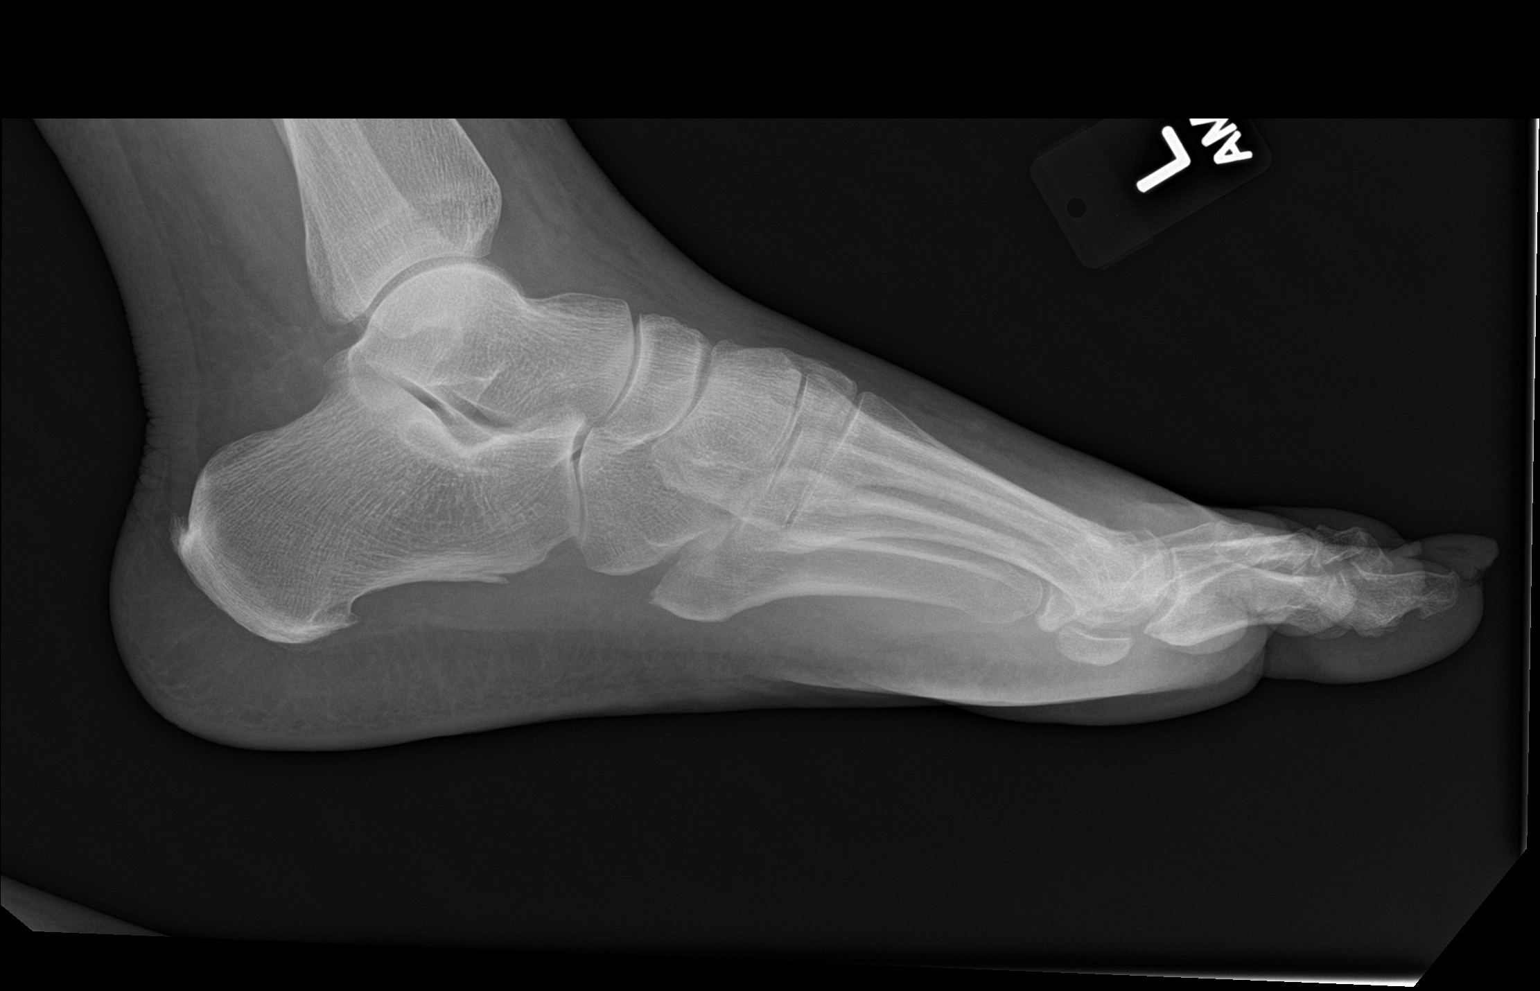

[3 of 3 positions shown; findings below may reference images not displayed]

FINDINGS: Hallux valgus deformity and mild degenerative changes at the first
MTP joint. There is some flattening of the normal plantar arch.
Calcaneal spurs. No fracture or dislocation seen. Mild diffuse
dorsal soft tissue swelling.
IMPRESSION: 1. No fracture.
2. Hallux valgus.
3. Mild first MTP joint degenerative changes.
4. Mild pes planus.

## 2016-11-15 ENCOUNTER — Emergency Department (HOSPITAL_BASED_OUTPATIENT_CLINIC_OR_DEPARTMENT_OTHER)
Admission: EM | Admit: 2016-11-15 | Discharge: 2016-11-15 | Disposition: A | Discharge status: Home / Self Care | Payer: Medicare Other | Attending: Emergency Medicine | Admitting: Emergency Medicine

## 2016-11-15 ENCOUNTER — Encounter (HOSPITAL_BASED_OUTPATIENT_CLINIC_OR_DEPARTMENT_OTHER): Payer: Self-pay

## 2016-11-15 ENCOUNTER — Emergency Department (HOSPITAL_BASED_OUTPATIENT_CLINIC_OR_DEPARTMENT_OTHER): Payer: Medicare Other

## 2016-11-15 DIAGNOSIS — Z87891 Personal history of nicotine dependence: Secondary | ICD-10-CM | POA: Diagnosis not present

## 2016-11-15 DIAGNOSIS — I10 Essential (primary) hypertension: Secondary | ICD-10-CM

## 2016-11-15 DIAGNOSIS — Z79899 Other long term (current) drug therapy: Secondary | ICD-10-CM | POA: Insufficient documentation

## 2016-11-15 LAB — BRAIN NATRIURETIC PEPTIDE: B NATRIURETIC PEPTIDE 5: 26.7 pg/mL (ref 0.0–100.0)

## 2016-11-15 LAB — CBC WITH DIFFERENTIAL/PLATELET
BASOS ABS: 0 10*3/uL (ref 0.0–0.1)
BASOS PCT: 0 %
EOS PCT: 1 %
Eosinophils Absolute: 0.1 10*3/uL (ref 0.0–0.7)
HCT: 41.1 % (ref 36.0–46.0)
Hemoglobin: 14.1 g/dL (ref 12.0–15.0)
Lymphocytes Relative: 45 %
Lymphs Abs: 2.7 10*3/uL (ref 0.7–4.0)
MCH: 31.8 pg (ref 26.0–34.0)
MCHC: 34.3 g/dL (ref 30.0–36.0)
MCV: 92.8 fL (ref 78.0–100.0)
MONO ABS: 0.6 10*3/uL (ref 0.1–1.0)
MONOS PCT: 9 %
Neutro Abs: 2.6 10*3/uL (ref 1.7–7.7)
Neutrophils Relative %: 44 %
PLATELETS: 243 10*3/uL (ref 150–400)
RBC: 4.43 MIL/uL (ref 3.87–5.11)
RDW: 13.1 % (ref 11.5–15.5)
WBC: 6 10*3/uL (ref 4.0–10.5)

## 2016-11-15 LAB — COMPREHENSIVE METABOLIC PANEL
ALBUMIN: 3.5 g/dL (ref 3.5–5.0)
ALT: 21 U/L (ref 14–54)
ANION GAP: 6 (ref 5–15)
AST: 21 U/L (ref 15–41)
Alkaline Phosphatase: 111 U/L (ref 38–126)
BILIRUBIN TOTAL: 0.4 mg/dL (ref 0.3–1.2)
BUN: 14 mg/dL (ref 6–20)
CO2: 25 mmol/L (ref 22–32)
Calcium: 8.8 mg/dL — ABNORMAL LOW (ref 8.9–10.3)
Chloride: 109 mmol/L (ref 101–111)
Creatinine, Ser: 1.09 mg/dL — ABNORMAL HIGH (ref 0.44–1.00)
GFR calc Af Amer: >60 mL/min (ref >60)
GFR, EST NON AFRICAN AMERICAN: 54 mL/min — AB (ref >60)
Glucose, Bld: 147 mg/dL — ABNORMAL HIGH (ref 65–99)
POTASSIUM: 3.8 mmol/L (ref 3.5–5.1)
Sodium: 140 mmol/L (ref 135–145)
TOTAL PROTEIN: 6.9 g/dL (ref 6.5–8.1)

## 2016-11-15 LAB — TROPONIN I

## 2016-11-15 LAB — MAGNESIUM: Magnesium: 1.9 mg/dL (ref 1.7–2.4)

## 2016-11-15 NOTE — ED Notes (Signed)
ED Provider at bedside. 

## 2016-11-15 NOTE — ED Provider Notes (Addendum)
[facility] Provider Note   CSN: [id number] Arrival date & time: 11/15/16  1450     History   Chief Complaint Chief Complaint  Patient presents with  . Hypertension    HPI Charlie Char is a 61 y.o. female.  HPI Patient went to her primary physician for mold the left side of the face. Was noted to have elevated blood pressure. Took her blood pressure medication and medication and was observed for several hours. There is no improvement of the blood pressure. Patient was then referred to the emergency department. She has had some dyspnea with exertion and lower extremity swelling. States she's been compliant with her medication. No chest pain, fever or chills. Has had intermittent headaches but currently denies pain. No visual changes. Past Medical History:  Diagnosis Date  . Acid reflux   . Brain tumor ([place])   . Headache   . High cholesterol   . Hypertension     There are no active problems to display for this patient.   Past Surgical History:  Procedure Laterality Date  . ABDOMINAL HYSTERECTOMY    . ANTERIOR CRUCIATE LIGAMENT REPAIR    . bladder tack    . KNEE SURGERY     right  . SHOULDER SURGERY      OB History    No data available       Home Medications    Prior to Admission medications   Medication Sig Start Date End Date Taking? Authorizing Provider  metFORMIN (GLUCOPHAGE) 500 MG tablet Take by mouth 2 (two) times daily with a meal.   Yes [provider]  Morphine-Naltrexone (EMBEDA) 20-0.8 MG CPCR Take by mouth.   Yes [provider]  Pregabalin (LYRICA PO) Take by mouth.   Yes [provider]  albuterol (PROVENTIL HFA;VENTOLIN HFA) 108 (90 Base) MCG/ACT inhaler Inhale 2 puffs into the lungs every 4 (four) hours as needed for wheezing or shortness of breath. 09/13/16   Joy, Shawn C, PA-C  albuterol (PROVENTIL) (2.5 MG/3ML) 0.083% nebulizer solution Take 2.5 mg by nebulization every 6 (six) hours as needed.     [provider]  albuterol (PROVENTIL) (2.5 MG/3ML) 0.083% nebulizer solution Take 3 mLs (2.5 mg total) by nebulization every 6 (six) hours as needed for wheezing or shortness of breath. 09/13/16   Joy, Shawn C, PA-C  atorvastatin (LIPITOR) 80 MG tablet Take 40 mg by mouth daily.    [provider]  colchicine 0.6 MG tablet Take 1 tablet (0.6 mg total) by mouth 2 (two) times daily. 10/17/15   Frederica Kuster, PA-C  ezetimibe (ZETIA) 10 MG tablet Take 10 mg by mouth daily.    [provider]  Fluticasone-Salmeterol (ADVAIR) 250-50 MCG/DOSE AEPB Inhale 1 puff into the lungs every 12 (twelve) hours.    [provider]  HYDROcodone-acetaminophen (NORCO) 5-325 MG per tablet Take 1-2 tablets by mouth every 6 (six) hours as needed (for pain). 03/19/15   Molpus, John, MD  indomethacin (INDOCIN) 25 MG capsule Take 1 capsule (25 mg total) by mouth 3 (three) times daily as needed. 10/17/15   Frederica Kuster, PA-C  LABETALOL HCL PO Take by mouth.    [provider]  losartan (COZAAR) 25 MG tablet Take 1 tablet (25 mg total) by mouth daily. 10/17/15   Frederica Kuster, PA-C  omeprazole (PRILOSEC) 20 MG capsule Take 20 mg by mouth daily.    [provider]  oxyCODONE-acetaminophen (PERCOCET/ROXICET) 5-325 MG per tablet Take 1 tablet  by mouth every 4 (four) hours as needed.    [provider]  potassium chloride (KLOR-CON) 20 MEQ packet Take 20 mEq by mouth daily. tablet    [provider]  tiotropium (SPIRIVA) 18 MCG inhalation capsule Place 18 mcg into inhaler and inhale daily.    [provider]  topiramate (TOPAMAX) 100 MG tablet Take 100 mg by mouth at bedtime.    [provider]  verapamil (CALAN-SR) 180 MG CR tablet Take 1 tablet (180 mg total) by mouth at bedtime. 10/17/15   Frederica Kuster, PA-C    Family History No family history on file.  Social History Social History  Substance Use Topics  . Smoking status: Former  Research scientist (life sciences)  . Smokeless tobacco: Never Used  . Alcohol use No     Allergies   Bactrim [sulfamethoxazole-trimethoprim]; Lisinopril; Sulfa antibiotics; and Tramadol   Review of Systems Review of Systems  Constitutional: Positive for fatigue. Negative for chills and fever.  HENT: Negative for congestion, sinus pressure and sore throat.   Eyes: Negative for photophobia and visual disturbance.  Respiratory: Positive for shortness of breath. Negative for cough, chest tightness and wheezing.   Cardiovascular: Positive for leg swelling. Negative for chest pain and palpitations.  Gastrointestinal: Negative for abdominal pain, constipation, diarrhea, nausea and vomiting.  Genitourinary: Negative for dysuria, flank pain and frequency.  Musculoskeletal: Negative for back pain, joint swelling, myalgias and neck pain.  Skin: Negative for rash and wound.  Neurological: Positive for headaches. Negative for dizziness, weakness, light-headedness and numbness.  All other systems reviewed and are negative.    Physical Exam Updated Vital Signs BP (!) 169/95 (BP Location: Left Arm)   Pulse 76   Temp 98.4 F (36.9 C) (Oral)   Resp 16   Ht 5\' 7"  (1.702 m)   Wt 118.8 kg (262 lb)   SpO2 95%   BMI 41.04 kg/m   Physical Exam  Constitutional: She is oriented to person, place, and time. She appears well-developed and well-nourished.  HENT:  Head: Normocephalic and atraumatic.  Mouth/Throat: Oropharynx is clear and moist. No oropharyngeal exudate.  2 cm in diameter raised nevus to the left cheek.  Eyes: EOM are normal. Pupils are equal, round, and reactive to light.  Neck: Normal range of motion. Neck supple. No JVD present.  Cardiovascular: Normal rate and regular rhythm.  Exam reveals no gallop and no friction rub.   No murmur heard. Pulmonary/Chest: Effort normal.  Diminished breath sounds bilateral bases.  Abdominal: Soft. Bowel sounds are normal. There is no tenderness. There is no rebound and  no guarding.  Musculoskeletal: Normal range of motion. She exhibits edema. She exhibits no tenderness.  1+ bilateral pitting edema. No asymmetry or calf tenderness.  Lymphadenopathy:    She has no cervical adenopathy.  Neurological: She is alert and oriented to person, place, and time.  Moving all extremities without focal deficit. Sensation fully intact.  Skin: Skin is warm and dry. Capillary refill takes less than 2 seconds. No rash noted. No erythema.  Psychiatric: She has a normal mood and affect. Her behavior is normal.  Nursing note and vitals reviewed.    ED Treatments / Results  Labs (all labs ordered are listed, but only abnormal results are displayed) Labs Reviewed  COMPREHENSIVE METABOLIC PANEL - Abnormal; Notable for the following:       Result Value   Glucose, Bld 147 (*)    Creatinine, Ser 1.09 (*)    Calcium 8.8 (*)  GFR calc non Af Amer 54 (*)    All other components within normal limits  BRAIN NATRIURETIC PEPTIDE  CBC WITH DIFFERENTIAL/PLATELET  TROPONIN I  MAGNESIUM    EKG  EKG Interpretation None       Radiology Dg Chest 2 View  Result Date: 11/15/2016 CLINICAL DATA:  Shortness of breath.  Hypertension. EXAM: CHEST  2 VIEW COMPARISON:  09/13/2016 and 01/28/2016 FINDINGS: Heart size and pulmonary vascularity are normal. Tiny area of scarring at the left lung base laterally. Lungs are otherwise clear. No effusions. No significant bone abnormality. IMPRESSION: No acute disease. Electronically Signed   By: Lorriane Shire M.D.   On: 11/15/2016 16:57    Procedures Procedures (including critical care time)  Medications Ordered in ED Medications - No data to display   Initial Impression / Assessment and Plan / ED Course  I have reviewed the triage vital signs and the nursing notes.  Pertinent labs & imaging results that were available during my care of the patient were reviewed by me and considered in my medical decision making (see chart for  details).     Work up essentially negative. The blood pressure has corrected in the emergency department without intervention. Patient will need to follow-up closely with her primary physician to manage her blood pressure. Return precautions given.  Final Clinical Impressions(s) / ED Diagnoses   Final diagnoses:  Hypertension, unspecified type    New Prescriptions New Prescriptions   No medications on file     Julianne Rice, MD 11/15/16 1734    Julianne Rice, MD 11/15/16 1740

## 2016-11-15 NOTE — ED Triage Notes (Signed)
C/o elevated BP today-was advised by PCP to come to ED-denies CP and HA-presents to triage in w/c

## 2017-01-13 ENCOUNTER — Emergency Department (HOSPITAL_BASED_OUTPATIENT_CLINIC_OR_DEPARTMENT_OTHER): Payer: Medicare Other

## 2017-01-13 ENCOUNTER — Emergency Department (HOSPITAL_BASED_OUTPATIENT_CLINIC_OR_DEPARTMENT_OTHER)
Admission: EM | Admit: 2017-01-13 | Discharge: 2017-01-14 | Disposition: A | Discharge status: Home / Self Care | Payer: Medicare Other | Attending: Emergency Medicine | Admitting: Emergency Medicine

## 2017-01-13 ENCOUNTER — Encounter (HOSPITAL_BASED_OUTPATIENT_CLINIC_OR_DEPARTMENT_OTHER): Payer: Self-pay | Admitting: *Deleted

## 2017-01-13 DIAGNOSIS — R0981 Nasal congestion: Secondary | ICD-10-CM | POA: Diagnosis present

## 2017-01-13 DIAGNOSIS — E119 Type 2 diabetes mellitus without complications: Secondary | ICD-10-CM | POA: Insufficient documentation

## 2017-01-13 DIAGNOSIS — Z7984 Long term (current) use of oral hypoglycemic drugs: Secondary | ICD-10-CM | POA: Insufficient documentation

## 2017-01-13 DIAGNOSIS — J029 Acute pharyngitis, unspecified: Secondary | ICD-10-CM | POA: Diagnosis not present

## 2017-01-13 DIAGNOSIS — Z79899 Other long term (current) drug therapy: Secondary | ICD-10-CM | POA: Diagnosis not present

## 2017-01-13 DIAGNOSIS — J4521 Mild intermittent asthma with (acute) exacerbation: Secondary | ICD-10-CM | POA: Diagnosis not present

## 2017-01-13 DIAGNOSIS — Z87891 Personal history of nicotine dependence: Secondary | ICD-10-CM | POA: Insufficient documentation

## 2017-01-13 DIAGNOSIS — R05 Cough: Secondary | ICD-10-CM | POA: Insufficient documentation

## 2017-01-13 DIAGNOSIS — I10 Essential (primary) hypertension: Secondary | ICD-10-CM | POA: Insufficient documentation

## 2017-01-13 DIAGNOSIS — J069 Acute upper respiratory infection, unspecified: Secondary | ICD-10-CM

## 2017-01-13 DIAGNOSIS — R519 Headache, unspecified: Secondary | ICD-10-CM

## 2017-01-13 DIAGNOSIS — R51 Headache: Secondary | ICD-10-CM | POA: Diagnosis not present

## 2017-01-13 HISTORY — DX: Type 2 diabetes mellitus without complications: E11.9

## 2017-01-13 HISTORY — DX: Unspecified asthma, uncomplicated: J45.909

## 2017-01-13 MED ORDER — PROCHLORPERAZINE EDISYLATE 5 MG/ML IJ SOLN
10.0000 mg | Freq: Once | INTRAMUSCULAR | Status: AC
  Administered 2017-01-14: 10 mg via INTRAVENOUS
  Filled 2017-01-13: qty 2

## 2017-01-13 MED ORDER — DEXAMETHASONE SODIUM PHOSPHATE 10 MG/ML IJ SOLN
10.0000 mg | Freq: Once | INTRAMUSCULAR | Status: AC
  Administered 2017-01-14: 10 mg via INTRAVENOUS
  Filled 2017-01-13: qty 1

## 2017-01-13 MED ORDER — IPRATROPIUM-ALBUTEROL 0.5-2.5 (3) MG/3ML IN SOLN
3.0000 mL | Freq: Once | RESPIRATORY_TRACT | Status: AC
  Administered 2017-01-13: 3 mL via RESPIRATORY_TRACT
  Filled 2017-01-13: qty 3

## 2017-01-13 MED ORDER — DIPHENHYDRAMINE HCL 50 MG/ML IJ SOLN
25.0000 mg | Freq: Once | INTRAMUSCULAR | Status: AC
  Administered 2017-01-14: 25 mg via INTRAVENOUS
  Filled 2017-01-13: qty 1

## 2017-01-13 NOTE — ED Provider Notes (Signed)
[facility] DEPT [facility] Provider Note   CSN: [id number] Arrival date & time: 01/13/17  2307  By signing my name below, I, Dora Sims, attest that this documentation has been prepared under the direction and in the presence of physician practitioner, Kellin Fifer, Barbette Hair, MD. Electronically Signed: Dora Sims, Scribe. 01/13/2017. 11:32 PM.  History   Chief Complaint Chief Complaint  Patient presents with  . Nasal Congestion  . Cough   The history is provided by the patient. No language interpreter was used.    HPI Comments: Kimberly Cooley is a 61 y.o. female with PMHx including asthma, HTN, and DM who presents to the Emergency Department with multiple complaints. She reports a persistent cough, dyspnea, wheezing, nasal congestion, sinus pressure, rhinorrhea, sore throat, and headaches for three days. Patient rates rates her headache at >10/10 in severity and reports phonophobia along with it. She has chronic migraines but notes her current headache is more severe than a typical migraine. Her sore throat is exacerbated by swallowing but she denies any dysphagia. She has had some mild chest pain today secondary to coughing. No medications or treatments tried. Patient has a nebulizer machine at home but has been unable to use it since onset of her cough, wheezing and dyspnea because she misplaced it. She notes that she cannot take ibuprofen "because of my stomach". Patient denies fevers, chills, numbness/tingling, focal weakness, vision changes, abdominal pain, nausea, vomiting, or any other associated symptoms.  Past Medical History:  Diagnosis Date  . Acid reflux   . Asthma   . Brain tumor ([place])   . Diabetes mellitus without complication ([place])   . Headache   . High cholesterol   . Hypertension     There are no active problems to display for this patient.   Past Surgical History:  Procedure Laterality Date  . ABDOMINAL HYSTERECTOMY    . ANTERIOR CRUCIATE LIGAMENT REPAIR    .  bladder tack    . KNEE SURGERY     right  . SHOULDER SURGERY      OB History    No data available       Home Medications    Prior to Admission medications   Medication Sig Start Date End Date Taking? Authorizing Provider  albuterol (PROVENTIL) (2.5 MG/3ML) 0.083% nebulizer solution Take 3 mLs (2.5 mg total) by nebulization every 4 (four) hours as needed for wheezing or shortness of breath. 01/14/17   Hazaiah Edgecombe, Barbette Hair, MD  atorvastatin (LIPITOR) 80 MG tablet Take 40 mg by mouth daily.    [provider]  colchicine 0.6 MG tablet Take 1 tablet (0.6 mg total) by mouth 2 (two) times daily. 10/17/15   Frederica Kuster, PA-C  ezetimibe (ZETIA) 10 MG tablet Take 10 mg by mouth daily.    [provider]  fluticasone (FLONASE) 50 MCG/ACT nasal spray Place 2 sprays into both nostrils daily. 01/14/17   Ronte Parker, Barbette Hair, MD  Fluticasone-Salmeterol (ADVAIR) 250-50 MCG/DOSE AEPB Inhale 1 puff into the lungs every 12 (twelve) hours.    [provider]  HYDROcodone-acetaminophen (NORCO) 5-325 MG per tablet Take 1-2 tablets by mouth every 6 (six) hours as needed (for pain). 03/19/15   Molpus, John, MD  indomethacin (INDOCIN) 25 MG capsule Take 1 capsule (25 mg total) by mouth 3 (three) times daily as needed. 10/17/15   Frederica Kuster, PA-C  LABETALOL HCL PO Take by mouth.    [provider]  losartan (COZAAR) 25 MG tablet Take 1 tablet (25  mg total) by mouth daily. 10/17/15   Frederica Kuster, PA-C  metFORMIN (GLUCOPHAGE) 500 MG tablet Take by mouth 2 (two) times daily with a meal.    [provider]  Morphine-Naltrexone (EMBEDA) 20-0.8 MG CPCR Take by mouth.    [provider]  omeprazole (PRILOSEC) 20 MG capsule Take 20 mg by mouth daily.    [provider]  oxyCODONE-acetaminophen (PERCOCET/ROXICET) 5-325 MG per tablet Take 1 tablet by mouth every 4 (four) hours as needed.    [provider]  potassium chloride (KLOR-CON) 20 MEQ  packet Take 20 mEq by mouth daily. tablet    [provider]  Pregabalin (LYRICA PO) Take by mouth.    [provider]  sodium chloride (OCEAN) 0.65 % SOLN nasal spray Place 1 spray into both nostrils as needed for congestion. 01/14/17   Kelyn Koskela, Barbette Hair, MD  tiotropium (SPIRIVA) 18 MCG inhalation capsule Place 18 mcg into inhaler and inhale daily.    [provider]  topiramate (TOPAMAX) 100 MG tablet Take 100 mg by mouth at bedtime.    [provider]  verapamil (CALAN-SR) 180 MG CR tablet Take 1 tablet (180 mg total) by mouth at bedtime. 10/17/15   Frederica Kuster, PA-C    Family History History reviewed. No pertinent family history.  Social History Social History  Substance Use Topics  . Smoking status: Former Research scientist (life sciences)  . Smokeless tobacco: Never Used  . Alcohol use No     Allergies   Bactrim [sulfamethoxazole-trimethoprim]; Lisinopril; Sulfa antibiotics; Tramadol; and Prednisone   Review of Systems Review of Systems  Constitutional: Negative for chills and fever.  HENT: Positive for congestion, rhinorrhea, sinus pressure and sore throat. Negative for trouble swallowing.   Eyes: Negative for visual disturbance.  Respiratory: Positive for cough, shortness of breath and wheezing.   Cardiovascular: Positive for chest pain (secondary to coughing).  Gastrointestinal: Negative for abdominal pain, nausea and vomiting.  Neurological: Positive for headaches. Negative for weakness and numbness.  All other systems reviewed and are negative.  Physical Exam Updated Vital Signs BP (!) 187/103   Pulse 73   Temp 99.2 F (37.3 C) (Oral)   Resp 18   Ht 5\' 7"  (1.702 m)   Wt 120.2 kg (265 lb)   SpO2 95%   BMI 41.50 kg/m   Physical Exam  Constitutional: She is oriented to person, place, and time. She appears well-developed and well-nourished.  Morbidly obese  HENT:  Head: Normocephalic and atraumatic.  Right Ear: External ear normal.  Left Ear:  External ear normal.  Mouth/Throat: Oropharynx is clear and moist. No oropharyngeal exudate.  Uvula midline  Eyes: Pupils are equal, round, and reactive to light. EOM are normal.  Neck: Normal range of motion. Neck supple.  No meningismus  Cardiovascular: Normal rate, regular rhythm and normal heart sounds.   No murmur heard. Pulmonary/Chest: Effort normal and breath sounds normal. No respiratory distress. She has no wheezes.  Abdominal: Soft. Bowel sounds are normal. There is no tenderness. There is no guarding.  Neurological: She is alert and oriented to person, place, and time.  Cranial nerves II through XII intact, 5 out of 5 strength in all 4 extremities, no dysmetria to finger-nose-finger  Skin: Skin is warm and dry.  Psychiatric: She has a normal mood and affect.  Nursing note and vitals reviewed.  ED Treatments / Results  Labs (all labs ordered are listed, but only abnormal results are displayed) Labs Reviewed - No data to  display  EKG  EKG Interpretation None       Radiology Dg Chest 2 View  Result Date: 01/14/2017 CLINICAL DATA:  Cough and shortness of breath. Left-sided chest pain. Asthma. EXAM: CHEST  2 VIEW COMPARISON:  04/17/2017 FINDINGS: The cardiomediastinal contours are normal. Moderate bronchial thickening. Pulmonary vasculature is normal. No consolidation, pleural effusion, or pneumothorax. No acute osseous abnormalities are seen. IMPRESSION: Moderate bronchial thickening, can be seen with bronchitis or asthma. Electronically Signed   By: Jeb Levering M.D.   On: 01/14/2017 00:14    Procedures Procedures (including critical care time)  DIAGNOSTIC STUDIES: Oxygen Saturation is 97% on RA, normal by my interpretation.    COORDINATION OF CARE: 11:28 PM Discussed treatment plan with pt at bedside and pt agreed to plan.  Medications Ordered in ED Medications  ipratropium-albuterol (DUONEB) 0.5-2.5 (3) MG/3ML nebulizer solution 3 mL (3 mLs Nebulization  Given 01/13/17 2334)  dexamethasone (DECADRON) injection 10 mg (10 mg Intravenous Given 01/14/17 0008)  prochlorperazine (COMPAZINE) injection 10 mg (10 mg Intravenous Given 01/14/17 0008)  diphenhydrAMINE (BENADRYL) injection 25 mg (25 mg Intravenous Given 01/14/17 0007)     Initial Impression / Assessment and Plan / ED Course  I have reviewed the triage vital signs and the nursing notes.  Pertinent labs & imaging results that were available during my care of the patient were reviewed by me and considered in my medical decision making (see chart for details).     Patient presents with cough, upper respiratory congestion, headache. She is nontoxic-appearing on exam. Nonfocal. Vital signs notable for blood pressure 187/103. Given constellation of symptoms, suspect upper respiratory infection versus allergies. She has no significant wheezing on exam but reports DuoNeb's help. She was given a duo neb and Decadron. She is also given a migraine cocktail to include Compazine and Benadryl. Chest x-ray shows no evidence of pneumonia. Given nonfocal neurologic exam, CT scanning deferred. 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 Patient much improved after migraine cocktail. On recheck, Rachel pain at 5 out of 10. She remains nonfocal. Will add nasal saline and nasal steroid for nasal congestion. Patient's albuterol was refilled. Patient was given strict return precautions.  After history, exam, and medical workup I feel the patient has been appropriately medically screened and is safe for discharge home. Pertinent diagnoses were discussed with the patient. Patient was given return precautions.   Final Clinical Impressions(s) / ED Diagnoses   Final diagnoses:  Viral upper respiratory tract infection  Nasal congestion  Mild intermittent asthma with exacerbation  Acute nonintractable headache, unspecified headache type    New Prescriptions New  Prescriptions   FLUTICASONE (FLONASE) 50 MCG/ACT NASAL SPRAY    Place 2 sprays into both nostrils daily.   SODIUM CHLORIDE (OCEAN) 0.65 % SOLN NASAL SPRAY    Place 1 spray into both nostrils as needed for congestion.   I personally performed the services described in this documentation, which was scribed in my presence. The recorded information has been reviewed and is accurate.    Merryl Hacker, MD 01/14/17 [phone]

## 2017-01-13 NOTE — ED Triage Notes (Signed)
Pt with cough nasal congestion and headache x 3 days

## 2017-01-13 NOTE — ED Notes (Signed)
Patient transported to X-ray 

## 2017-01-14 MED ORDER — ALBUTEROL SULFATE (2.5 MG/3ML) 0.083% IN NEBU: 2.5000 mg | INHALATION_SOLUTION | RESPIRATORY_TRACT | 0 refills | Status: AC | PRN

## 2017-01-14 MED ORDER — FLUTICASONE PROPIONATE 50 MCG/ACT NA SUSP: 2.0000 | Freq: Every day | NASAL | 0 refills | Status: AC

## 2017-01-14 MED ORDER — SALINE SPRAY 0.65 % NA SOLN: 1.0000 | NASAL | 0 refills | Status: AC | PRN

## 2017-01-14 NOTE — Discharge Instructions (Signed)
You were seen today for upper respiratory congestion, cough, headache. Symptoms are most consistent with an upper respiratory infection. You also likely have a mild exacerbation of her asthma. Start nasal saline and nasal steroids at home to drain your inner ears and sinuses. You were given a dose of Decadron and her nebulizer was refilled. Follow-up with your primary physician if you have any new worsening symptoms.

## 2017-01-14 NOTE — ED Notes (Signed)
ED Provider at bedside. 

## 2017-02-23 ENCOUNTER — Encounter (HOSPITAL_BASED_OUTPATIENT_CLINIC_OR_DEPARTMENT_OTHER): Payer: Self-pay | Admitting: Emergency Medicine

## 2017-02-23 ENCOUNTER — Emergency Department (HOSPITAL_BASED_OUTPATIENT_CLINIC_OR_DEPARTMENT_OTHER): Payer: Medicare Other

## 2017-02-23 ENCOUNTER — Emergency Department (HOSPITAL_BASED_OUTPATIENT_CLINIC_OR_DEPARTMENT_OTHER)
Admission: EM | Admit: 2017-02-23 | Discharge: 2017-02-23 | Disposition: A | Discharge status: Home / Self Care | Payer: Medicare Other | Attending: Emergency Medicine | Admitting: Emergency Medicine

## 2017-02-23 DIAGNOSIS — R079 Chest pain, unspecified: Secondary | ICD-10-CM

## 2017-02-23 DIAGNOSIS — Z7984 Long term (current) use of oral hypoglycemic drugs: Secondary | ICD-10-CM | POA: Diagnosis not present

## 2017-02-23 DIAGNOSIS — Z87891 Personal history of nicotine dependence: Secondary | ICD-10-CM | POA: Diagnosis not present

## 2017-02-23 DIAGNOSIS — E119 Type 2 diabetes mellitus without complications: Secondary | ICD-10-CM | POA: Diagnosis not present

## 2017-02-23 DIAGNOSIS — M791 Myalgia: Secondary | ICD-10-CM | POA: Insufficient documentation

## 2017-02-23 DIAGNOSIS — J45909 Unspecified asthma, uncomplicated: Secondary | ICD-10-CM | POA: Insufficient documentation

## 2017-02-23 DIAGNOSIS — R0789 Other chest pain: Secondary | ICD-10-CM | POA: Insufficient documentation

## 2017-02-23 DIAGNOSIS — I10 Essential (primary) hypertension: Secondary | ICD-10-CM | POA: Diagnosis not present

## 2017-02-23 DIAGNOSIS — M7918 Myalgia, other site: Secondary | ICD-10-CM

## 2017-02-23 DIAGNOSIS — Z79899 Other long term (current) drug therapy: Secondary | ICD-10-CM | POA: Insufficient documentation

## 2017-02-23 HISTORY — DX: Other chronic pain: G89.29

## 2017-02-23 LAB — BASIC METABOLIC PANEL
Anion gap: 7 (ref 5–15)
BUN: 10 mg/dL (ref 6–20)
CALCIUM: 9 mg/dL (ref 8.9–10.3)
CO2: 23 mmol/L (ref 22–32)
CREATININE: 0.98 mg/dL (ref 0.44–1.00)
Chloride: 110 mmol/L (ref 101–111)
GFR calc non Af Amer: >60 mL/min (ref >60)
Glucose, Bld: 133 mg/dL — ABNORMAL HIGH (ref 65–99)
Potassium: 3.2 mmol/L — ABNORMAL LOW (ref 3.5–5.1)
SODIUM: 140 mmol/L (ref 135–145)

## 2017-02-23 LAB — CBC
HCT: 43.3 % (ref 36.0–46.0)
Hemoglobin: 14.5 g/dL (ref 12.0–15.0)
MCH: 30.9 pg (ref 26.0–34.0)
MCHC: 33.5 g/dL (ref 30.0–36.0)
MCV: 92.1 fL (ref 78.0–100.0)
Platelets: 273 10*3/uL (ref 150–400)
RBC: 4.7 MIL/uL (ref 3.87–5.11)
RDW: 13.9 % (ref 11.5–15.5)
WBC: 6.6 10*3/uL (ref 4.0–10.5)

## 2017-02-23 LAB — TROPONIN I

## 2017-02-23 MED ORDER — KETOROLAC TROMETHAMINE 30 MG/ML IJ SOLN
30.0000 mg | Freq: Once | INTRAMUSCULAR | Status: AC
  Administered 2017-02-23: 30 mg via INTRAVENOUS
  Filled 2017-02-23: qty 1

## 2017-02-23 NOTE — ED Provider Notes (Signed)
[facility] DEPT [facility] Provider Note   CSN: [id number] Arrival date & time: 02/23/17  1008     History   Chief Complaint Chief Complaint  Patient presents with  . Chest Pain    HPI Kimberly Cooley is a 61 y.o. female.  HPI  61 y.o. female with a hx of DM, HTN, HLD, presents to the Emergency Department today due to chest pain x 2 days. Seen at [facility] for same on Thursday due to chest pain and hypertension. Unremarkable work up. Pt states she has had chest pain constantly for 2 days. States pain is worse with turning to the right as well as leaning forward. Pain reduced with lying still. Notes pain on right chest. No N/V. No diaphoresis. No headaches. No numbness/tingling. No abdominal pain. No meds PTA. Rates pain 5/10 currently. Aching sensation. Denies any heavy lifting. No cough/congestion. No fevers. No hx DVT/PE. No recent surgeries. No recent travel. No other symptoms noted.    Past Medical History:  Diagnosis Date  . Acid reflux   . Asthma   . Brain tumor ([place])   . Diabetes mellitus without complication ([place])   . Headache   . High cholesterol   . Hypertension     There are no active problems to display for this patient.   Past Surgical History:  Procedure Laterality Date  . ABDOMINAL HYSTERECTOMY    . ANTERIOR CRUCIATE LIGAMENT REPAIR    . bladder tack    . KNEE SURGERY     right  . SHOULDER SURGERY      OB History    No data available       Home Medications    Prior to Admission medications   Medication Sig Start Date End Date Taking? Authorizing Provider  albuterol (PROVENTIL) (2.5 MG/3ML) 0.083% nebulizer solution Take 3 mLs (2.5 mg total) by nebulization every 4 (four) hours as needed for wheezing or shortness of breath. 01/14/17   Horton, Barbette Hair, MD  atorvastatin (LIPITOR) 80 MG tablet Take 40 mg by mouth daily.    [provider]  colchicine 0.6 MG tablet Take 1 tablet (0.6 mg total) by mouth 2 (two) times daily. 10/17/15   Frederica Kuster,  PA-C  ezetimibe (ZETIA) 10 MG tablet Take 10 mg by mouth daily.    [provider]  fluticasone (FLONASE) 50 MCG/ACT nasal spray Place 2 sprays into both nostrils daily. 01/14/17   Horton, Barbette Hair, MD  Fluticasone-Salmeterol (ADVAIR) 250-50 MCG/DOSE AEPB Inhale 1 puff into the lungs every 12 (twelve) hours.    [provider]  HYDROcodone-acetaminophen (NORCO) 5-325 MG per tablet Take 1-2 tablets by mouth every 6 (six) hours as needed (for pain). 03/19/15   Molpus, John, MD  indomethacin (INDOCIN) 25 MG capsule Take 1 capsule (25 mg total) by mouth 3 (three) times daily as needed. 10/17/15   Frederica Kuster, PA-C  LABETALOL HCL PO Take by mouth.    [provider]  losartan (COZAAR) 25 MG tablet Take 1 tablet (25 mg total) by mouth daily. 10/17/15   Frederica Kuster, PA-C  metFORMIN (GLUCOPHAGE) 500 MG tablet Take by mouth 2 (two) times daily with a meal.    [provider]  Morphine-Naltrexone (EMBEDA) 20-0.8 MG CPCR Take by mouth.    [provider]  omeprazole (PRILOSEC) 20 MG capsule Take 20 mg by mouth daily.    [provider]  oxyCODONE-acetaminophen (PERCOCET/ROXICET) 5-325 MG per tablet Take 1 tablet by mouth every 4 (four)  hours as needed.    [provider]  potassium chloride (KLOR-CON) 20 MEQ packet Take 20 mEq by mouth daily. tablet    [provider]  Pregabalin (LYRICA PO) Take by mouth.    [provider]  sodium chloride (OCEAN) 0.65 % SOLN nasal spray Place 1 spray into both nostrils as needed for congestion. 01/14/17   Horton, Barbette Hair, MD  tiotropium (SPIRIVA) 18 MCG inhalation capsule Place 18 mcg into inhaler and inhale daily.    [provider]  topiramate (TOPAMAX) 100 MG tablet Take 100 mg by mouth at bedtime.    [provider]  verapamil (CALAN-SR) 180 MG CR tablet Take 1 tablet (180 mg total) by mouth at bedtime. 10/17/15   Frederica Kuster, PA-C    Family History No  family history on file.  Social History Social History  Substance Use Topics  . Smoking status: Former Research scientist (life sciences)  . Smokeless tobacco: Never Used  . Alcohol use No     Allergies   Bactrim [sulfamethoxazole-trimethoprim]; Lisinopril; Sulfa antibiotics; Tramadol; and Prednisone   Review of Systems Review of Systems ROS reviewed and all are negative for acute change except as noted in the HPI.  Physical Exam Updated Vital Signs Ht 5\' 7"  (1.702 m)   Wt 119.7 kg (264 lb)   BMI 41.35 kg/m   Physical Exam  Constitutional: She is oriented to person, place, and time. Vital signs are normal. She appears well-developed and well-nourished. No distress.  HENT:  Head: Normocephalic and atraumatic.  Right Ear: Hearing, tympanic membrane, external ear and ear canal normal.  Left Ear: Hearing, tympanic membrane, external ear and ear canal normal.  Nose: Nose normal.  Mouth/Throat: Uvula is midline, oropharynx is clear and moist and mucous membranes are normal. No trismus in the jaw. No oropharyngeal exudate, posterior oropharyngeal erythema or tonsillar abscesses.  Eyes: Pupils are equal, round, and reactive to light. Conjunctivae and EOM are normal.  Neck: Normal range of motion. Neck supple. No tracheal deviation present.  Cardiovascular: Normal rate, regular rhythm, S1 normal, S2 normal, normal heart sounds, intact distal pulses and normal pulses.   Pulmonary/Chest: Effort normal and breath sounds normal. No respiratory distress. She has no decreased breath sounds. She has no wheezes. She has no rhonchi. She has no rales.  TTP right anterior chest wall. No palpable or visible deformities   Abdominal: Normal appearance and bowel sounds are normal. There is no tenderness.  Musculoskeletal: Normal range of motion.  Neurological: She is alert and oriented to person, place, and time.  Skin: Skin is warm and dry.  Psychiatric: She has a normal mood and affect. Her speech is normal and behavior is  normal. Thought content normal.  Nursing note and vitals reviewed.    ED Treatments / Results  Labs (all labs ordered are listed, but only abnormal results are displayed) Labs Reviewed  BASIC METABOLIC PANEL - Abnormal; Notable for the following:       Result Value   Potassium 3.2 (*)    Glucose, Bld 133 (*)    All other components within normal limits  CBC  TROPONIN I    EKG  EKG Interpretation None       Radiology Dg Chest 2 View  Result Date: 02/23/2017 CLINICAL DATA:  Two-day history of mid chest pain. Patient was seen 2 days ago in the emergency department at [facility] for dizziness and hypertension. EXAM: CHEST  2 VIEW COMPARISON:  01/13/2017, 525 1,018 and  earlier. FINDINGS: Cardiac silhouette upper normal in size to slightly enlarged. Thoracic aorta mildly atherosclerotic, unchanged. Mildly prominent bronchovascular markings diffusely and mild central peribronchial thickening, more so than on prior examinations. Lungs otherwise clear. No localized airspace consolidation. No pleural effusions. No pneumothorax. Normal pulmonary vascularity. Visualized bony thorax intact. IMPRESSION: 1. Mild changes of acute bronchitis and/or asthma without focal airspace pneumonia. 2. Stable borderline heart size. Electronically Signed   By: Evangeline Dakin M.D.   On: 02/23/2017 11:03    Procedures Procedures (including critical care time)  Medications Ordered in ED Medications  ketorolac (TORADOL) 30 MG/ML injection 30 mg (30 mg Intravenous Given 02/23/17 1113)     Initial Impression / Assessment and Plan / ED Course  I have reviewed the triage vital signs and the nursing notes.  Pertinent labs & imaging results that were available during my care of the patient were reviewed by me and considered in my medical decision making (see chart for details).  Final Clinical Impressions(s) / ED Diagnoses  {I have reviewed and evaluated the relevant laboratory values. {I  have reviewed and evaluated the relevant imaging studies. {I have interpreted the relevant EKG. {I have reviewed the relevant previous healthcare records.  {I obtained HPI from historian. {Patient discussed with supervising physician.  ED Course:  Assessment: Pt is a 61 y.o. female with a hx of DM, HTN, HLD, presents to the Emergency Department today due to chest pain x 2 days. Seen at [facility] for same on Thursday due to chest pain and hypertension. Unremarkable work up. Pt states she has had chest pain constantly for 2 days. States pain is worse with turning to the right as well as leaning forward. Pain reduced with lying still. Notes pain on right chest. No N/V. No diaphoresis. No headaches. No numbness/tingling. No abdominal pain. No meds PTA. Rates pain 5/10 currently. Aching sensation. Denies any heavy lifting. No cough/congestion. No fevers. No hx DVT/PE. No recent surgeries. No recent travel.. Given anaglesia in ED. Patient is to be discharged with recommendation to follow up with PCP in regards to today's hospital visit. Chest pain is not likely of cardiac or pulmonary etiology d/t presentation, perc negative, VSS, no tracheal deviation, no JVD or new murmur, RRR, breath sounds equal bilaterally, EKG without acute abnormalities, negative troponin, and negative CXR. Full ED CP work up at [facility] with unremarkable results. Low indication for ACS given duration of symptoms as well as symptoms improving with Toradol. Likely musculoskeletal. Pt has been advised  to the ED is CP becomes exertional, associated with diaphoresis or nausea, radiates to left jaw/arm, worsens or becomes concerning in any way. Pt appears reliable for follow up and is agreeable to discharge. Patient is in no acute distress. Vital Signs are stable. Patient is able to ambulate. Patient able to tolerate PO.   Disposition/Plan:  DC Home Additional Verbal discharge instructions given and discussed with patient.  Pt Instructed to f/u with PCP  in the next week for evaluation and treatment of symptoms. Return precautions given Pt acknowledges and agrees with plan  Supervising Physician Forde Dandy, MD  Final diagnoses:  Chest pain, unspecified type  Chest wall pain  Musculoskeletal pain  Hypertension, unspecified type    New Prescriptions New Prescriptions   No medications on file     Shary Decamp, PA-C 02/23/17 1150    Forde Dandy, MD 02/23/17 [phone]

## 2017-02-23 NOTE — Discharge Instructions (Signed)
Please read and follow all provided instructions.  Your diagnoses today include:  1. Chest pain, unspecified type   2. Chest wall pain   3. Musculoskeletal pain   4. Hypertension, unspecified type     Tests performed today include: An EKG of your heart A chest x-ray Cardiac enzymes - a blood test for heart muscle damage Blood counts and electrolytes Vital signs. See below for your results today.   Medications prescribed:   Take any prescribed medications only as directed.  Follow-up instructions: Please follow-up with your primary care provider as soon as you can for further evaluation of your symptoms.   Return instructions:  SEEK IMMEDIATE MEDICAL ATTENTION IF: You have severe chest pain, especially if the pain is crushing or pressure-like and spreads to the arms, back, neck, or jaw, or if you have sweating, nausea (feeling sick to your stomach), or shortness of breath. THIS IS AN EMERGENCY. Don't wait to see if the pain will go away. Get medical help at once. Call 911 or 0 (operator). DO NOT drive yourself to the hospital.  Your chest pain gets worse and does not go away with rest.  You have an attack of chest pain lasting longer than usual, despite rest and treatment with the medications your caregiver has prescribed.  You wake from sleep with chest pain or shortness of breath. You feel dizzy or faint. You have chest pain not typical of your usual pain for which you originally saw your caregiver.  You have any other emergent concerns regarding your health.  Additional Information: Chest pain comes from many different causes. Your caregiver has diagnosed you as having chest pain that is not specific for one problem, but does not require admission.  You are at low risk for an acute heart condition or other serious illness.   Your vital signs today were: BP (!) 158/77    Pulse 70    Temp 98.3 F (36.8 C) (Oral)    Resp (!) 24    Ht 5\' 7"  (1.702 m)    Wt 119.7 kg (264 lb)     SpO2 97%    BMI 41.35 kg/m  If your blood pressure (BP) was elevated above 135/85 this visit, please have this repeated by your doctor within one month. --------------

## 2017-02-23 NOTE — ED Notes (Signed)
Pt to XR at this time

## 2017-02-23 NOTE — ED Notes (Signed)
ED Provider at bedside. 

## 2017-02-23 NOTE — ED Triage Notes (Signed)
Chest pain x 2 days . Was eval in ED at [facility] on Thursday for dizziness and HBP.

## 2017-10-01 ENCOUNTER — Emergency Department (HOSPITAL_BASED_OUTPATIENT_CLINIC_OR_DEPARTMENT_OTHER)
Admission: EM | Admit: 2017-10-01 | Discharge: 2017-10-01 | Disposition: A | Discharge status: Home / Self Care | Payer: Medicare Other | Attending: Emergency Medicine | Admitting: Emergency Medicine

## 2017-10-01 ENCOUNTER — Other Ambulatory Visit: Payer: Self-pay

## 2017-10-01 ENCOUNTER — Emergency Department (HOSPITAL_BASED_OUTPATIENT_CLINIC_OR_DEPARTMENT_OTHER): Payer: Medicare Other

## 2017-10-01 ENCOUNTER — Encounter (HOSPITAL_BASED_OUTPATIENT_CLINIC_OR_DEPARTMENT_OTHER): Payer: Self-pay

## 2017-10-01 DIAGNOSIS — Z87891 Personal history of nicotine dependence: Secondary | ICD-10-CM | POA: Diagnosis not present

## 2017-10-01 DIAGNOSIS — Z79899 Other long term (current) drug therapy: Secondary | ICD-10-CM | POA: Insufficient documentation

## 2017-10-01 DIAGNOSIS — R05 Cough: Secondary | ICD-10-CM | POA: Diagnosis present

## 2017-10-01 DIAGNOSIS — J069 Acute upper respiratory infection, unspecified: Secondary | ICD-10-CM

## 2017-10-01 DIAGNOSIS — Z7984 Long term (current) use of oral hypoglycemic drugs: Secondary | ICD-10-CM | POA: Diagnosis not present

## 2017-10-01 DIAGNOSIS — I10 Essential (primary) hypertension: Secondary | ICD-10-CM | POA: Diagnosis not present

## 2017-10-01 DIAGNOSIS — B9789 Other viral agents as the cause of diseases classified elsewhere: Secondary | ICD-10-CM | POA: Insufficient documentation

## 2017-10-01 DIAGNOSIS — J208 Acute bronchitis due to other specified organisms: Secondary | ICD-10-CM

## 2017-10-01 DIAGNOSIS — J45909 Unspecified asthma, uncomplicated: Secondary | ICD-10-CM | POA: Diagnosis not present

## 2017-10-01 DIAGNOSIS — E119 Type 2 diabetes mellitus without complications: Secondary | ICD-10-CM | POA: Diagnosis not present

## 2017-10-01 DIAGNOSIS — R0981 Nasal congestion: Secondary | ICD-10-CM | POA: Insufficient documentation

## 2017-10-01 DIAGNOSIS — R6883 Chills (without fever): Secondary | ICD-10-CM | POA: Insufficient documentation

## 2017-10-01 MED ORDER — ACETAMINOPHEN-CODEINE 120-12 MG/5ML PO SOLN: 10.0000 mL | ORAL | 0 refills | Status: AC | PRN

## 2017-10-01 MED ORDER — DOXYCYCLINE HYCLATE 100 MG PO CAPS: 100.0000 mg | ORAL_CAPSULE | Freq: Two times a day (BID) | ORAL | 0 refills | Status: AC

## 2017-10-01 MED ORDER — GUAIFENESIN ER 1200 MG PO TB12: 1.0000 | ORAL_TABLET | Freq: Two times a day (BID) | ORAL | 0 refills | Status: AC

## 2017-10-01 MED FILL — DOXYCYCLINE HYC 100 MG CAP: 100 | 10 days supply | Qty: 20 | Fill #0

## 2017-10-01 MED FILL — MUCINEX ER 1,200 MG TABLET: 1200 | 14 days supply | Qty: 28 | Fill #0

## 2017-10-01 MED FILL — ACETAMINOPHEN-CODEINE ELX: 120-12 | 2 days supply | Qty: 120 | Fill #0

## 2017-10-01 NOTE — Discharge Instructions (Addendum)
Return here as needed.  Your x-ray did not show any signs of pneumonia.  Follow-up with your primary doctor.  We are however treating with antibiotics due to your chronic respiratory issues.

## 2017-10-01 NOTE — ED Triage Notes (Signed)
C/o flu like sx x 2 weeks-NAD-to triage in w/c

## 2017-10-01 NOTE — ED Provider Notes (Signed)
[facility] EMERGENCY DEPARTMENT Provider Note   CSN: [id number] Arrival date & time: 10/01/17  1325     History   Chief Complaint Chief Complaint  Patient presents with  . Cough    HPI Kimberly Cooley is a 62 y.o. female.  HPI Patient presents to the emergency department with cough nasal congestion over the last 2 weeks.  The patient states that she has had history of pneumonia and that was her main concern.  She states she has had some chills but no documented fevers at home.  Patient states she has been taking over-the-counter NyQuil and DayQuil.  Patient states nothing seems to make the condition better or worse.  The patient denies chest pain, shortness of breath, headache,blurred vision, neck pain, fever,weakness, numbness, dizziness, anorexia, edema, abdominal pain, nausea, vomiting, diarrhea, rash, back pain, dysuria, hematemesis, bloody stool, near syncope, or syncope. Past Medical History:  Diagnosis Date  . Acid reflux   . Asthma   . Brain tumor ([place])   . Chronic pain    In pain clinic   . Diabetes mellitus without complication ([place])   . Headache   . High cholesterol   . Hypertension     There are no active problems to display for this patient.   Past Surgical History:  Procedure Laterality Date  . ABDOMINAL HYSTERECTOMY    . ANTERIOR CRUCIATE LIGAMENT REPAIR    . bladder tack    . KNEE SURGERY     right  . SHOULDER SURGERY       OB History   None      Home Medications    Prior to Admission medications   Medication Sig Start Date End Date Taking? Authorizing Provider  atorvastatin (LIPITOR) 80 MG tablet Take 40 mg by mouth daily.   Yes [provider]  ezetimibe (ZETIA) 10 MG tablet Take 10 mg by mouth daily.   Yes [provider]  LABETALOL HCL PO Take 100 mg by mouth 2 times daily at 12 noon and 4 pm.    Yes [provider]  losartan (COZAAR) 25 MG tablet Take 1 tablet (25 mg total) by mouth daily. Patient  taking differently: Take 100 mg by mouth daily.  10/17/15  Yes Frederica Kuster, PA-C  metFORMIN (GLUCOPHAGE) 500 MG tablet Take by mouth 2 (two) times daily with a meal.   Yes [provider]  potassium chloride (KLOR-CON) 20 MEQ packet Take 20 mEq by mouth daily. tablet   Yes [provider]  tiotropium (SPIRIVA) 18 MCG inhalation capsule Place 18 mcg into inhaler and inhale daily.   Yes [provider]  topiramate (TOPAMAX) 100 MG tablet Take 100 mg by mouth at bedtime.   Yes [provider]  verapamil (CALAN-SR) 180 MG CR tablet Take 1 tablet (180 mg total) by mouth at bedtime. 10/17/15  Yes Frederica Kuster, PA-C  albuterol (PROVENTIL) (2.5 MG/3ML) 0.083% nebulizer solution Take 3 mLs (2.5 mg total) by nebulization every 4 (four) hours as needed for wheezing or shortness of breath. 01/14/17   Horton, Barbette Hair, MD  colchicine 0.6 MG tablet Take 1 tablet (0.6 mg total) by mouth 2 (two) times daily. 10/17/15   Frederica Kuster, PA-C  fluticasone (FLONASE) 50 MCG/ACT nasal spray Place 2 sprays into both nostrils daily. 01/14/17   Horton, Barbette Hair, MD  Fluticasone-Salmeterol (ADVAIR) 250-50 MCG/DOSE AEPB Inhale 1 puff into the lungs every 12 (twelve) hours.    [provider]  HYDROcodone-acetaminophen (  NORCO) 5-325 MG per tablet Take 1-2 tablets by mouth every 6 (six) hours as needed (for pain). 03/19/15   Molpus, John, MD  indomethacin (INDOCIN) 25 MG capsule Take 1 capsule (25 mg total) by mouth 3 (three) times daily as needed. 10/17/15   Frederica Kuster, PA-C  Morphine-Naltrexone (EMBEDA) 20-0.8 MG CPCR Take by mouth at bedtime.     [provider]  omeprazole (PRILOSEC) 20 MG capsule Take 20 mg by mouth daily.    [provider]  oxyCODONE-acetaminophen (PERCOCET/ROXICET) 5-325 MG per tablet Take 1 tablet by mouth every 4 (four) hours as needed.    [provider]  Pregabalin (LYRICA PO) Take by mouth.    [provider]    sodium chloride (OCEAN) 0.65 % SOLN nasal spray Place 1 spray into both nostrils as needed for congestion. 01/14/17   Horton, Barbette Hair, MD    Family History No family history on file.  Social History Social History   Tobacco Use  . Smoking status: Former Research scientist (life sciences)  . Smokeless tobacco: Never Used  Substance Use Topics  . Alcohol use: No  . Drug use: No     Allergies   Bactrim [sulfamethoxazole-trimethoprim]; Lisinopril; Sulfa antibiotics; Tramadol; and Prednisone   Review of Systems Review of Systems All other systems negative except as documented in the HPI. All pertinent positives and negatives as reviewed in the HPI.  Physical Exam Updated Vital Signs BP (!) 187/99 (BP Location: Right Arm)   Pulse 96   Temp 98.9 F (37.2 C) (Oral)   Resp 20   Ht 5\' 7"  (1.702 m)   Wt 120.2 kg (265 lb)   SpO2 96%   BMI 41.50 kg/m   Physical Exam  Constitutional: She is oriented to person, place, and time. She appears well-developed and well-nourished. No distress.  HENT:  Head: Normocephalic and atraumatic.  Mouth/Throat: Oropharynx is clear and moist.  Eyes: Pupils are equal, round, and reactive to light.  Neck: Normal range of motion. Neck supple.  Cardiovascular: Normal rate, regular rhythm and normal heart sounds. Exam reveals no gallop and no friction rub.  No murmur heard. Pulmonary/Chest: Effort normal and breath sounds normal. No stridor. No respiratory distress. She has no wheezes. She has no rales.  Neurological: She is alert and oriented to person, place, and time. She exhibits normal muscle tone. Coordination normal.  Skin: Skin is warm and dry. Capillary refill takes less than 2 seconds. No rash noted. No erythema.  Psychiatric: She has a normal mood and affect. Her behavior is normal.  Nursing note and vitals reviewed.    ED Treatments / Results  Labs (all labs ordered are listed, but only abnormal results are displayed) Labs Reviewed - No data to  display  EKG None  Radiology Dg Chest 2 View  Result Date: 10/01/2017 CLINICAL DATA:  Flu like symptoms for 2 weeks, hypertension, diabetes mellitus, asthma EXAM: CHEST - 2 VIEW COMPARISON:  07/04/2017 FINDINGS: Upper normal heart size. Mediastinal contours and pulmonary vascularity normal. Lungs clear. No pleural effusion or pneumothorax. Osseous structures unremarkable. IMPRESSION: No acute abnormalities. Electronically Signed   By: Lavonia Dana M.D.   On: 10/01/2017 14:14    Procedures Procedures (including critical care time)  Medications Ordered in ED Medications - No data to display   Initial Impression / Assessment and Plan / ED Course  I have reviewed the triage vital signs and the nursing notes.  Pertinent labs & imaging results that were available during my care  of the patient were reviewed by me and considered in my medical decision making (see chart for details).     Patient has a history of COPD and emphysema.  She will be treated with doxycycline and cough suppressant along with Mucinex.  Patient is advised to return here as needed.  Her current x-rays do not show any abnormalities.  And her vital signs remained stable other than an elevated blood pressure here in the emergency department.  Patient does not appear to be in any acute distress.  Has a long-standing history of hypertension as well but does not show any signs of hypertensive crisis or urgency. Final Clinical Impressions(s) / ED Diagnoses   Final diagnoses:  None    ED Discharge Orders    None       Dalia Heading, PA-C 10/01/17 1507    Mesner, Corene Cornea, MD 10/02/17 [id number]

## 2017-10-01 NOTE — ED Notes (Signed)
ED Provider at bedside. 

## 2018-09-08 IMAGING — CR DG CHEST 2V
2 series · 2 of 2 positions shown · non-contrast
Comparison: 04/17/2017

CLINICAL DATA: Cough and shortness of breath. Left-sided chest
pain. Asthma.

EXAM:
CHEST  2 VIEW

[w chest pa *]
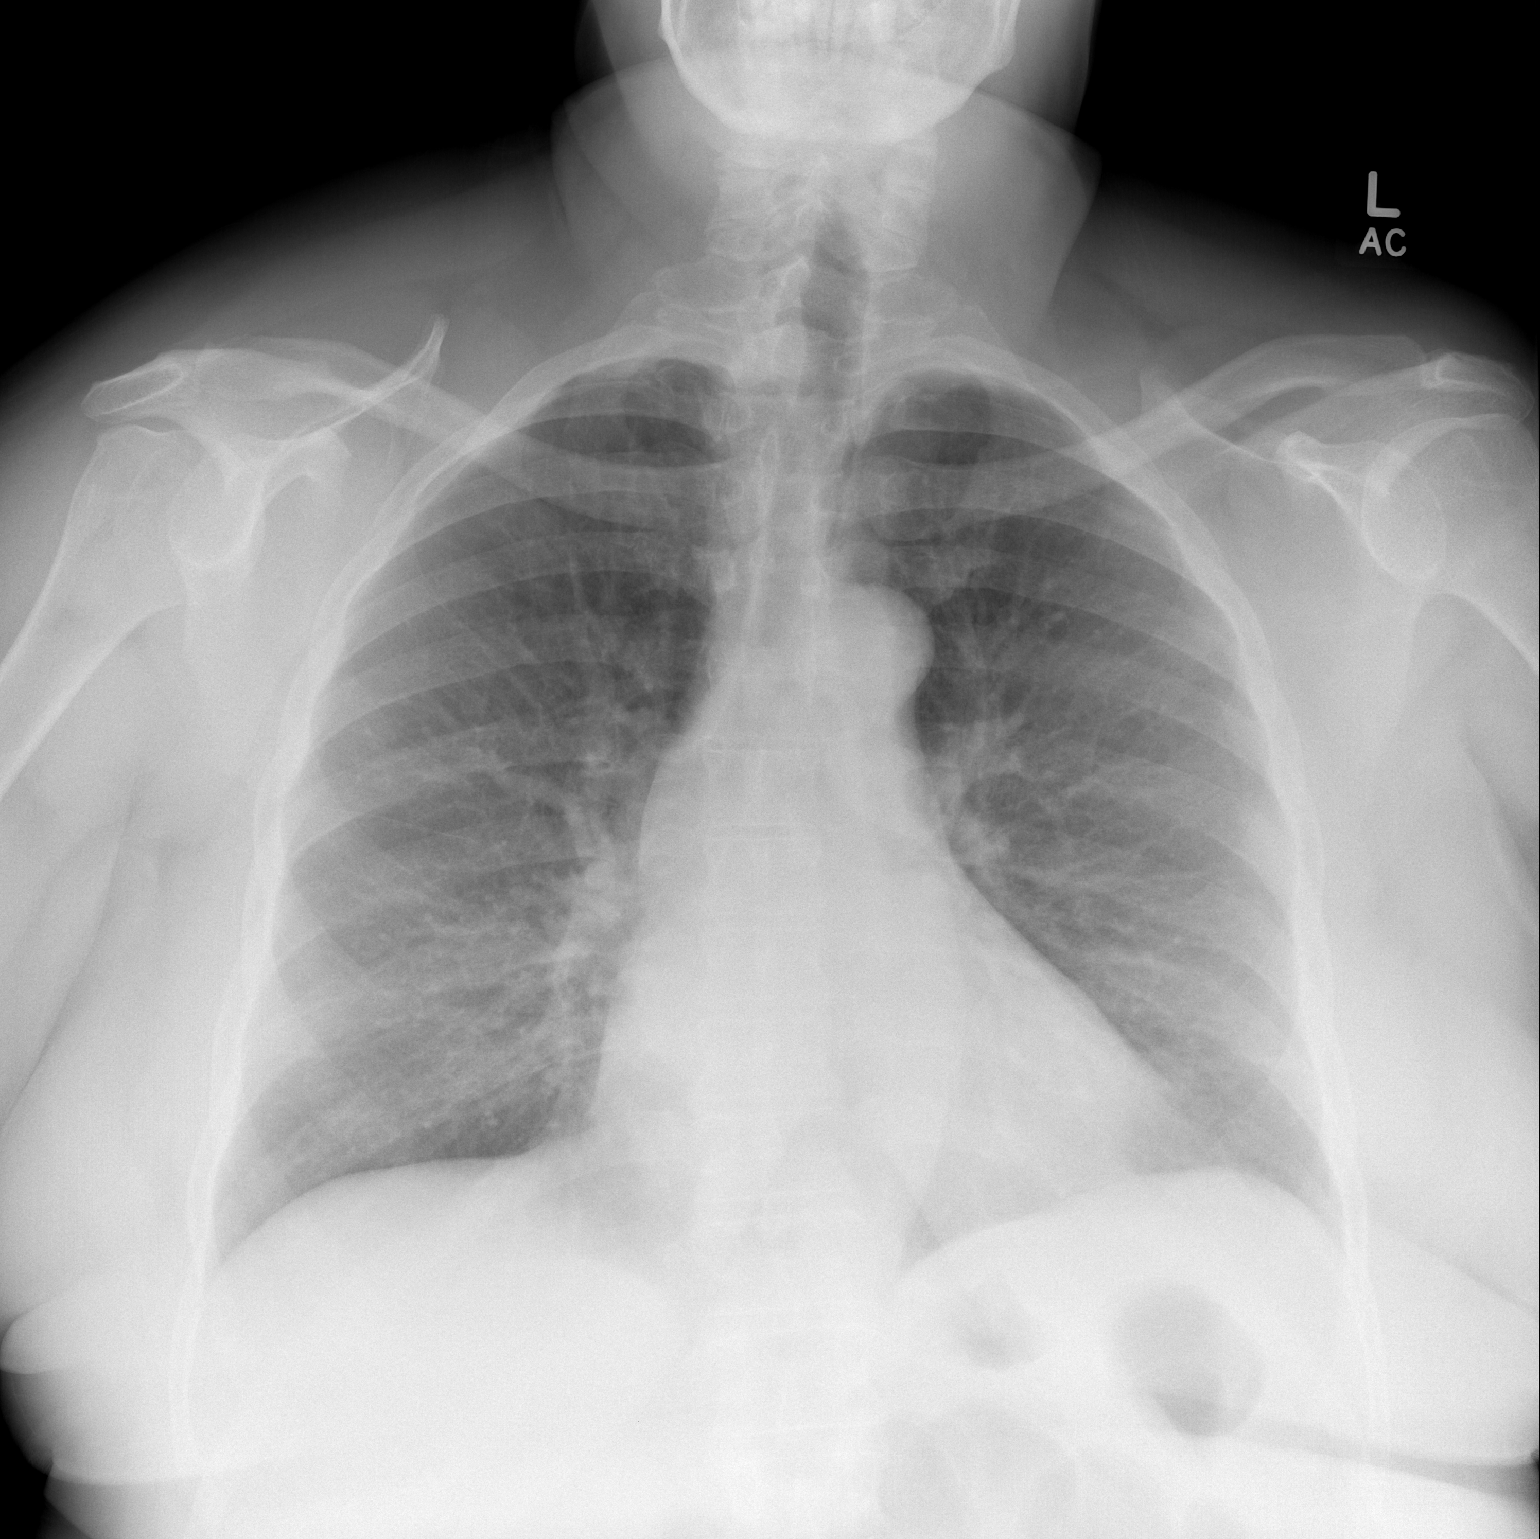

[w chest lat]
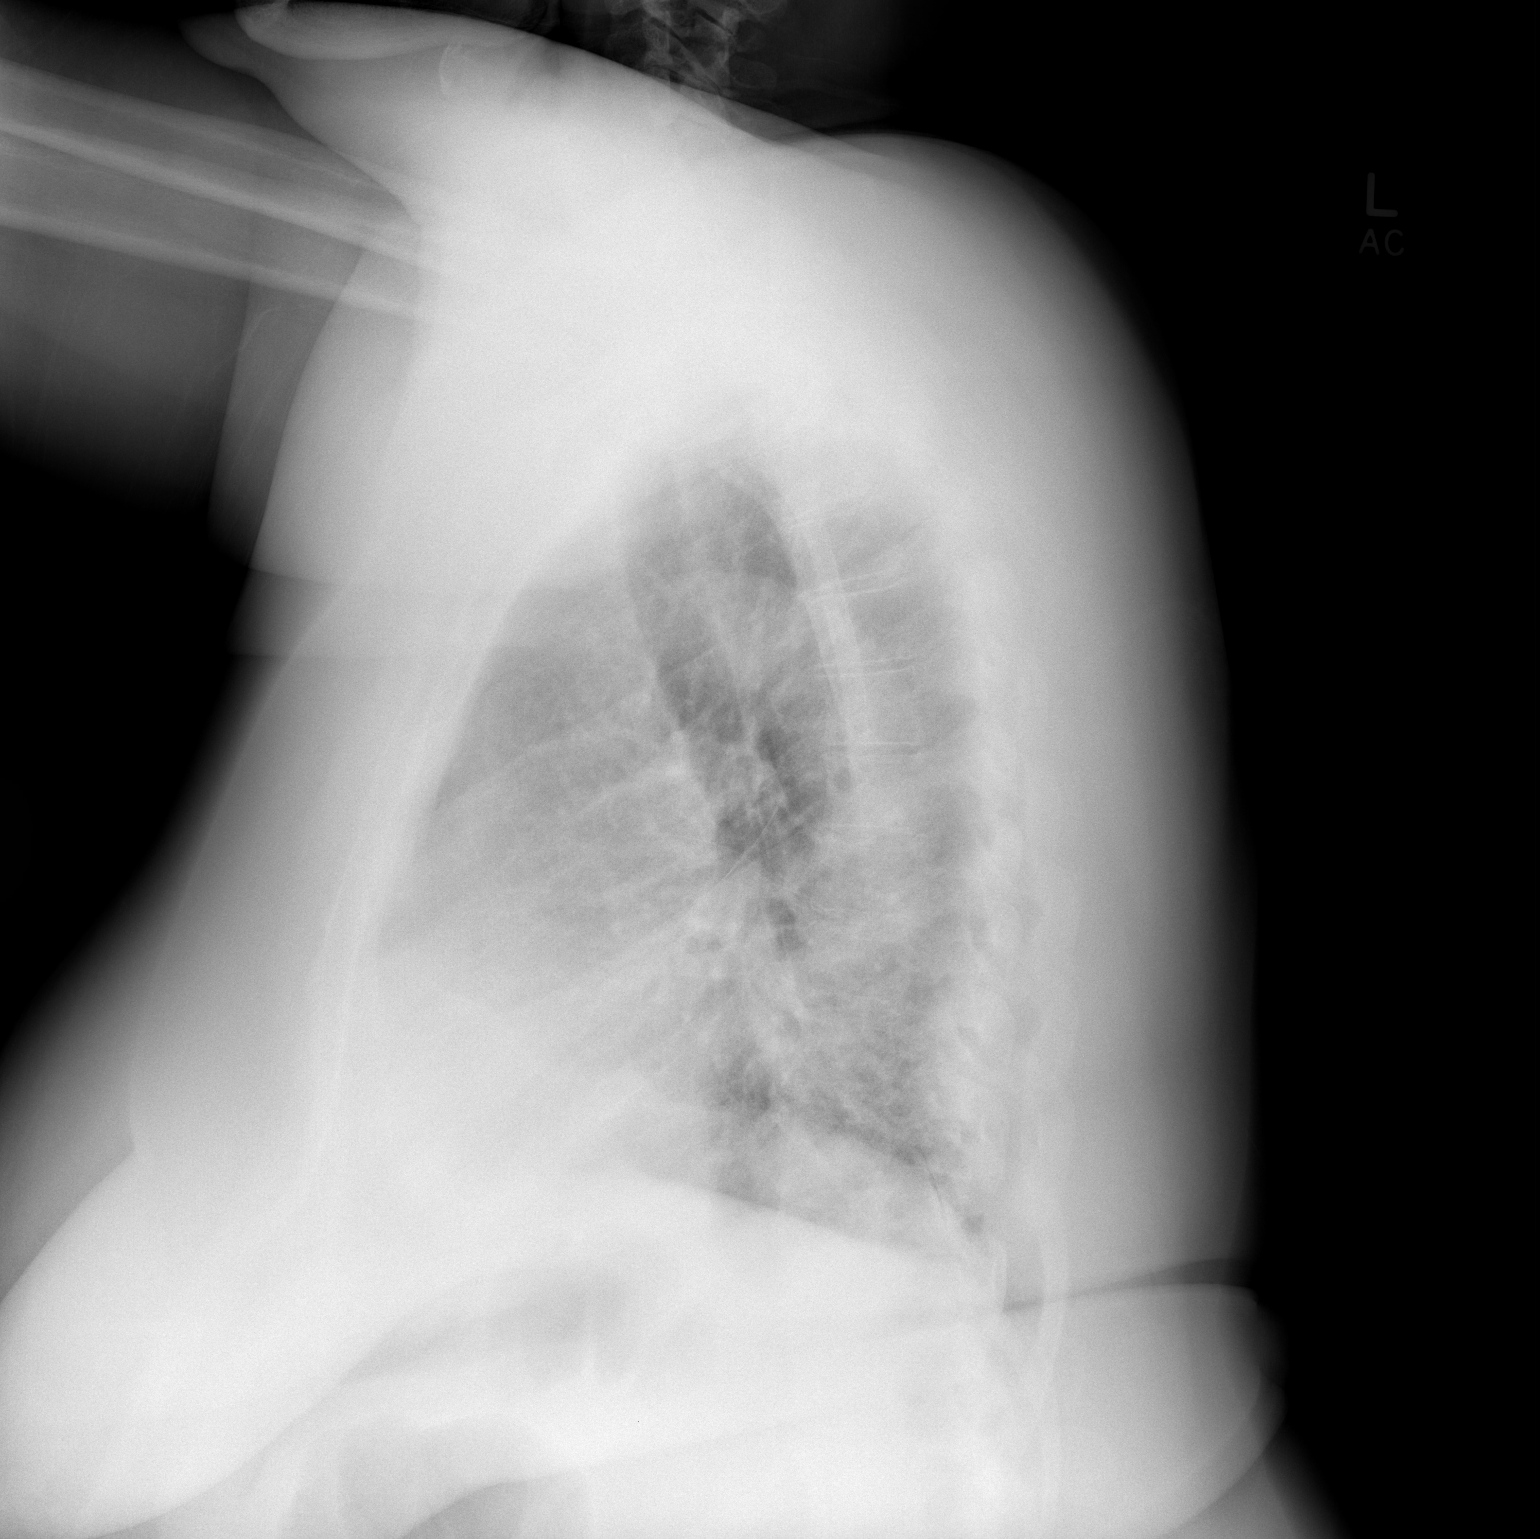

[2 of 2 positions shown; findings below may reference images not displayed]

FINDINGS: The cardiomediastinal contours are normal. Moderate bronchial
thickening. Pulmonary vasculature is normal. No consolidation,
pleural effusion, or pneumothorax. No acute osseous abnormalities
are seen.
IMPRESSION: Moderate bronchial thickening, can be seen with bronchitis or
asthma.

## 2018-12-23 DEATH — deceased
